# Patient Record
Sex: Female | Born: 2000 | Race: Black or African American | Hispanic: No | Marital: Single | Smoking: Current every day smoker
Health system: Southern US, Community
[De-identification: ages and names within clinical notes are randomized; demographics above are authoritative.]

## PROBLEM LIST (undated history)

## (undated) ENCOUNTER — Inpatient Hospital Stay (HOSPITAL_COMMUNITY): Payer: Self-pay

## (undated) DIAGNOSIS — R42 Dizziness and giddiness: Secondary | ICD-10-CM

## (undated) DIAGNOSIS — R55 Syncope and collapse: Secondary | ICD-10-CM

## (undated) DIAGNOSIS — Z789 Other specified health status: Secondary | ICD-10-CM

## (undated) DIAGNOSIS — G43909 Migraine, unspecified, not intractable, without status migrainosus: Secondary | ICD-10-CM

## (undated) DIAGNOSIS — T7840XA Allergy, unspecified, initial encounter: Secondary | ICD-10-CM

## (undated) HISTORY — DX: Allergy, unspecified, initial encounter: T78.40XA

## (undated) HISTORY — DX: Dizziness and giddiness: R42

## (undated) HISTORY — DX: Syncope and collapse: R55

## (undated) HISTORY — DX: Migraine, unspecified, not intractable, without status migrainosus: G43.909

## (undated) HISTORY — PX: NO PAST SURGERIES: SHX2092

---
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
## 2018-01-20 ENCOUNTER — Ambulatory Visit (INDEPENDENT_AMBULATORY_CARE_PROVIDER_SITE_OTHER): Payer: Medicaid Other | Admitting: Obstetrics and Gynecology

## 2018-01-20 ENCOUNTER — Other Ambulatory Visit: Payer: Medicaid Other

## 2018-01-20 ENCOUNTER — Encounter: Payer: Self-pay | Admitting: Obstetrics and Gynecology

## 2018-01-20 DIAGNOSIS — R8271 Bacteriuria: Secondary | ICD-10-CM | POA: Insufficient documentation

## 2018-01-20 DIAGNOSIS — O2343 Unspecified infection of urinary tract in pregnancy, third trimester: Secondary | ICD-10-CM | POA: Diagnosis not present

## 2018-01-20 DIAGNOSIS — A749 Chlamydial infection, unspecified: Secondary | ICD-10-CM

## 2018-01-20 DIAGNOSIS — Z3403 Encounter for supervision of normal first pregnancy, third trimester: Secondary | ICD-10-CM | POA: Diagnosis not present

## 2018-01-20 DIAGNOSIS — Z3400 Encounter for supervision of normal first pregnancy, unspecified trimester: Secondary | ICD-10-CM

## 2018-01-20 DIAGNOSIS — O2340 Unspecified infection of urinary tract in pregnancy, unspecified trimester: Secondary | ICD-10-CM

## 2018-01-20 DIAGNOSIS — O9989 Other specified diseases and conditions complicating pregnancy, childbirth and the puerperium: Secondary | ICD-10-CM

## 2018-01-20 DIAGNOSIS — O98813 Other maternal infectious and parasitic diseases complicating pregnancy, third trimester: Secondary | ICD-10-CM | POA: Diagnosis present

## 2018-01-20 DIAGNOSIS — O98819 Other maternal infectious and parasitic diseases complicating pregnancy, unspecified trimester: Principal | ICD-10-CM

## 2018-01-20 DIAGNOSIS — Z23 Encounter for immunization: Secondary | ICD-10-CM

## 2018-01-20 NOTE — Progress Notes (Signed)
Subjective: Carol Guerrero is a G1P0000 who presents to the [facility] today for ob visit.  She does not have a history of any mental health concerns. She is currently sexually active. She is currently using no method for birth control. She has had recent STD screening on 10/27/2017 and was tested for [facility] & Chlamydia positive. Pt reports she works part time at [employer] located in [place]. CSW and Pt discuss parenting and social concerns. Pt reports feeling safe at home and no history of domestic violence.    BP 99/61   Pulse 77   Wt 163 lb 9.6 oz (74.2 kg)   LMP 06/29/2017   BMI 27.22 kg/m   Birth Control History:  OCP  MDM Patient counseled on all options for birth control today including LARC. Patient desires PP IUD initiated for birth control.   Assessment:  17 y.o. female considering PP IUD for birth control  Plan: Pt signed up for babyscripts  No additional referral sent   Gwyndolyn Saxon, [place] 01/20/2018 11:05 AM

## 2018-01-20 NOTE — Progress Notes (Signed)
Home Medicaid Form completed  

## 2018-01-20 NOTE — Progress Notes (Signed)
Prenatal Visit Note Date: 01/20/2018 Clinic: [facility]  Subjective:  Carol Guerrero is a 17 y.o. G1P0000 at [redacted]w[redacted]d being seen today for ongoing prenatal care.  She is currently monitored for the following issues for this low-risk pregnancy and has Supervision of normal first pregnancy, antepartum; Chlamydia infection during pregnancy, antepartum; and UTI in pregnancy on their problem list.  Patient reports no complaints.   Contractions: Not present. Vag. Bleeding: None.  Movement: Present. Denies leaking of fluid.   The following portions of the patient's history were reviewed and updated as appropriate: allergies, current medications, past family history, past medical history, past social history, past surgical history and problem list. Problem list updated.  Objective:   Vitals:   01/20/18 1043  BP: 99/61  Pulse: 77  Weight: 163 lb 9.6 oz (74.2 kg)    Fetal Status: Fetal Heart Rate (bpm): 150 Fundal Height: 29 cm Movement: Present     General:  Alert, oriented and cooperative. Patient is in no acute distress.  Skin: Skin is warm and dry. No rash noted.   Cardiovascular: Normal heart rate noted  Respiratory: Normal respiratory effort, no problems with respiration noted  Abdomen: Soft, gravid, appropriate for gestational age. Pain/Pressure: Absent     Pelvic:  Cervical exam deferred        Extremities: Normal range of motion.  Edema: None  Mental Status: Normal mood and affect. Normal behavior. Normal judgment and thought content.   Urinalysis:      Assessment and Plan:  Pregnancy: G1P0000 at [redacted]w[redacted]d  1. Chlamydia infection during pregnancy, antepartum Rpt at 36wks  2. Urinary tract infection in mother during pregnancy, antepartum toc neg  3. Pregnancy D/w her and she is seriously considering a PP IUD. Can d/w pt more. 28wk labs today  Preterm labor symptoms and general obstetric precautions including but not limited to vaginal bleeding, contractions,  leaking of fluid and fetal movement were reviewed in detail with the patient. Please refer to After Visit Summary for other counseling recommendations.  Return in about 2 weeks (around 02/03/2018) for 2-3wk rob.   Frederick Bing, MD

## 2018-01-21 LAB — CBC
HEMATOCRIT: 28.6 % — AB (ref 34.0–46.6)
Hemoglobin: 9.5 g/dL — ABNORMAL LOW (ref 11.1–15.9)
MCH: 32.9 pg (ref 26.6–33.0)
MCHC: 33.2 g/dL (ref 31.5–35.7)
MCV: 99 fL — AB (ref 79–97)
PLATELETS: 210 10*3/uL (ref 150–450)
RBC: 2.89 x10E6/uL — ABNORMAL LOW (ref 3.77–5.28)
RDW: 12.6 % (ref 12.3–15.4)
WBC: 12.3 10*3/uL — ABNORMAL HIGH (ref 3.4–10.8)

## 2018-01-21 LAB — GLUCOSE TOLERANCE, 2 HOURS W/ 1HR
Glucose, 1 hour: 94 mg/dL (ref 65–179)
Glucose, 2 hour: 89 mg/dL (ref 65–152)
Glucose, Fasting: 79 mg/dL (ref 65–91)

## 2018-01-21 LAB — HIV ANTIBODY (ROUTINE TESTING W REFLEX): HIV Screen 4th Generation wRfx: NONREACTIVE

## 2018-01-21 LAB — RPR: RPR Ser Ql: NONREACTIVE

## 2018-01-25 ENCOUNTER — Encounter: Payer: Self-pay | Admitting: Obstetrics and Gynecology

## 2018-01-25 DIAGNOSIS — O99019 Anemia complicating pregnancy, unspecified trimester: Secondary | ICD-10-CM

## 2018-01-25 HISTORY — DX: Anemia complicating pregnancy, unspecified trimester: O99.019

## 2018-01-27 ENCOUNTER — Encounter: Payer: Self-pay | Admitting: *Deleted

## 2018-02-09 ENCOUNTER — Encounter: Payer: Self-pay | Admitting: *Deleted

## 2018-02-11 ENCOUNTER — Ambulatory Visit (INDEPENDENT_AMBULATORY_CARE_PROVIDER_SITE_OTHER): Payer: Medicaid Other | Admitting: Family Medicine

## 2018-02-11 VITALS — BP 117/57 | HR 88 | Wt 170.4 lb

## 2018-02-11 DIAGNOSIS — O2343 Unspecified infection of urinary tract in pregnancy, third trimester: Secondary | ICD-10-CM

## 2018-02-11 DIAGNOSIS — O2340 Unspecified infection of urinary tract in pregnancy, unspecified trimester: Secondary | ICD-10-CM

## 2018-02-11 DIAGNOSIS — D649 Anemia, unspecified: Secondary | ICD-10-CM

## 2018-02-11 DIAGNOSIS — O26843 Uterine size-date discrepancy, third trimester: Secondary | ICD-10-CM

## 2018-02-11 DIAGNOSIS — O9989 Other specified diseases and conditions complicating pregnancy, childbirth and the puerperium: Secondary | ICD-10-CM

## 2018-02-11 DIAGNOSIS — Z3403 Encounter for supervision of normal first pregnancy, third trimester: Secondary | ICD-10-CM

## 2018-02-11 DIAGNOSIS — D509 Iron deficiency anemia, unspecified: Secondary | ICD-10-CM

## 2018-02-11 DIAGNOSIS — Z3400 Encounter for supervision of normal first pregnancy, unspecified trimester: Secondary | ICD-10-CM

## 2018-02-11 DIAGNOSIS — O99013 Anemia complicating pregnancy, third trimester: Secondary | ICD-10-CM

## 2018-02-11 DIAGNOSIS — R8271 Bacteriuria: Secondary | ICD-10-CM

## 2018-02-11 MED ORDER — FERROUS SULFATE 325 (65 FE) MG PO TABS: 325.0000 mg | ORAL_TABLET | Freq: Two times a day (BID) | ORAL | 3 refills | Status: DC

## 2018-02-11 NOTE — Progress Notes (Signed)
   PRENATAL VISIT NOTE Subjective:  Carol Guerrero is a 17 y.o. G1P0000 at 2586w3d being seen today for ongoing prenatal care.  She is currently monitored for the following issues for this low-risk pregnancy and has Supervision of normal first pregnancy, antepartum; Chlamydia infection during pregnancy, antepartum; UTI in pregnancy; and Anemia in pregnancy on their problem list.  - declined flu shot  - question about epidurals - wanting to know how performed, when decision has to be made, afraid of pain but also afraid of needle in back   Patient reports fatigue.  Contractions: Not present. Vag. Bleeding: None.  Movement: Present. Denies leaking of fluid.   The following portions of the patient's history were reviewed and updated as appropriate: allergies, current medications, past family history, past medical history, past social history, past surgical history and problem list. Problem list updated.  Objective:   Vitals:   02/11/18 1019  BP: (!) 117/57  Pulse: 88  Weight: 170 lb 6.4 oz (77.3 kg)   Fetal Status: Fetal Heart Rate (bpm): 150 Fundal Height: 28 cm Movement: Present     General:  Alert, oriented and cooperative. Patient is in no acute distress.  Skin: Skin is warm and dry. No rash noted.   Cardiovascular: Normal heart rate noted  Respiratory: Normal respiratory effort, no problems with respiration noted  Abdomen: Soft, gravid, appropriate for gestational age.  Pain/Pressure: Absent     Pelvic: Cervical exam deferred        Extremities: Normal range of motion.  Edema: None  Mental Status: Normal mood and affect. Normal behavior. Normal judgment and thought content.   Assessment and Plan:  Pregnancy: G1P0000 at 7386w3d  1. Supervision of normal first pregnancy, antepartum  Fundal Heigh < Dates  -- prenatal record reviewed and UTD -- fundal height < dates so will obtain follow-up growth U/S   2. Asymptomatic Bacteriuria: Had negative TOC.  -- repeat urine culture today  (for third trimester screening)   3. Anemia during pregnancy in third trimester -- 28 week labs reviewed and notable for normocytic anemia (HgB 9.5) - most likely related to iron deficiency but will also check ferritin, B12, folate and reticulocytes   Preterm labor symptoms and general obstetric precautions including but not limited to vaginal bleeding, contractions, leaking of fluid and fetal movement were reviewed in detail with the patient. Please refer to After Visit Summary for other counseling recommendations.  Return in about 2 weeks (around 02/25/2018) for LROB.  Future Appointments  Date Time Provider Department Center  02/20/2018  8:45 AM [facility] US 2 [facility]  02/27/2018 11:15 AM Judeth HornLawrence, Erin[facility]    Tamera StandsLaurel S Keiosha Cancro, DO

## 2018-02-11 NOTE — Patient Instructions (Signed)
Pregnancy and Anemia Anemia is a condition in which the concentration of red blood cells or hemoglobin in the blood is below normal. Hemoglobin is a substance in red blood cells that carries oxygen to the tissues of the body. Anemia results in not enough oxygen reaching these tissues. Anemia during pregnancy is common because the fetus uses more iron and folic acid as it is developing. Your body may not produce enough red blood cells because of this. Also, during pregnancy, the liquid part of the blood (plasma) increases by about 50%, and the red blood cells increase by only 25%. This lowers the concentration of the red blood cells and creates a natural anemia-like situation. What are the causes? The most common cause of anemia during pregnancy is not having enough iron in the body to make red blood cells (iron deficiency anemia). Other causes may include:  Folic acid deficiency.  Vitamin B12 deficiency.  Certain prescription or over-the-counter medicines.  Certain medical conditions or infections that destroy red blood cells.  A low platelet count and bleeding caused by antibodies that go through the placenta to the fetus from the mother's blood. What are the signs or symptoms? Mild anemia may not be noticeable. If it becomes severe, symptoms may include:  Tiredness.  Shortness of breath, especially with exercise.  Weakness.  Fainting.  Pale looking skin.  Headaches.  Feeling a fast or irregular heartbeat (palpitations). How is this diagnosed? The type of anemia is usually diagnosed from your family and medical history and blood tests. How is this treated? Treatment of anemia during pregnancy depends on the cause of the anemia. Treatment can include:  Supplements of iron, vitamin B12, or folic acid.  A blood transfusion. This may be needed if blood loss is severe.  Hospitalization. This may be needed if there is significant continual blood loss.  Dietary changes. Follow  these instructions at home:  Follow your dietitian's or health care provider's dietary recommendations.  Increase your vitamin C intake. This will help the stomach absorb more iron.  Eat a diet rich in iron. This would include foods such as:  Liver.  Beef.  Whole grain bread.  Eggs.  Dried fruit.  Take iron and vitamins as directed by your health care provider.  Eat green leafy vegetables. These are a good source of folic acid. Contact a health care provider if:  You have frequent or lasting headaches.  You are looking pale.  You are bruising easily. Get help right away if:  You have extreme weakness, shortness of breath, or chest pain.  You become dizzy or have trouble concentrating.  You have heavy vaginal bleeding.  You develop a rash.  You have bloody or black, tarry stools.  You faint.  You vomit up blood.  You vomit repeatedly.  You have abdominal pain.  You have a fever or persistent symptoms for more than 2-3 days.  You have a fever and your symptoms suddenly get worse.  You are dehydrated. This information is not intended to replace advice given to you by your health care provider. Make sure you discuss any questions you have with your health care provider. Document Released: 05/03/2000 Document Revised: 10/12/2015 Document Reviewed: 12/16/2012 Elsevier Interactive Patient Education  2017 [employer]vier Inc[employer]  

## 2018-02-12 LAB — FERRITIN: FERRITIN: 18 ng/mL (ref 15–77)

## 2018-02-12 LAB — B12 AND FOLATE PANEL
Folate: 5.6 ng/mL (ref >3.0)
VITAMIN B 12: 235 pg/mL (ref 232–1245)

## 2018-02-12 LAB — RETICULOCYTES: RETIC CT PCT: 3.4 % — AB (ref 0.6–2.6)

## 2018-02-13 LAB — URINE CULTURE, OB REFLEX: Organism ID, Bacteria: NO GROWTH

## 2018-02-13 LAB — CULTURE, OB URINE

## 2018-02-20 ENCOUNTER — Ambulatory Visit (HOSPITAL_COMMUNITY)
Admission: RE | Admit: 2018-02-20 | Discharge: 2018-02-20 | Disposition: A | Discharge status: Home / Self Care | Payer: Medicaid Other | Source: Ambulatory Visit | Attending: Family Medicine | Admitting: Family Medicine

## 2018-02-20 DIAGNOSIS — Z3A33 33 weeks gestation of pregnancy: Secondary | ICD-10-CM

## 2018-02-20 DIAGNOSIS — O26843 Uterine size-date discrepancy, third trimester: Secondary | ICD-10-CM | POA: Insufficient documentation

## 2018-02-27 ENCOUNTER — Encounter: Payer: Medicaid Other | Admitting: Student

## 2018-03-03 ENCOUNTER — Encounter: Payer: Medicaid Other | Admitting: Advanced Practice Midwife

## 2018-03-09 ENCOUNTER — Other Ambulatory Visit (HOSPITAL_COMMUNITY)
Admission: RE | Admit: 2018-03-09 | Discharge: 2018-03-09 | Disposition: A | Discharge status: Home / Self Care | Payer: Medicaid Other | Source: Ambulatory Visit | Attending: Advanced Practice Midwife | Admitting: Advanced Practice Midwife

## 2018-03-09 ENCOUNTER — Encounter: Payer: Self-pay | Admitting: Advanced Practice Midwife

## 2018-03-09 ENCOUNTER — Ambulatory Visit (INDEPENDENT_AMBULATORY_CARE_PROVIDER_SITE_OTHER): Payer: Medicaid Other | Admitting: Advanced Practice Midwife

## 2018-03-09 VITALS — BP 109/66 | HR 88 | Wt 176.0 lb

## 2018-03-09 DIAGNOSIS — Z23 Encounter for immunization: Secondary | ICD-10-CM | POA: Diagnosis not present

## 2018-03-09 DIAGNOSIS — Z3400 Encounter for supervision of normal first pregnancy, unspecified trimester: Secondary | ICD-10-CM

## 2018-03-09 DIAGNOSIS — Z3403 Encounter for supervision of normal first pregnancy, third trimester: Secondary | ICD-10-CM | POA: Insufficient documentation

## 2018-03-09 NOTE — Progress Notes (Signed)
   PRENATAL VISIT NOTE  Subjective:  Carol Guerrero is a 17 y.o. G1P0000 at [redacted]w[redacted]d being seen today for ongoing prenatal care.  She is currently monitored for the following issues for this low-risk pregnancy and has Supervision of normal first pregnancy, antepartum; Chlamydia infection during pregnancy, antepartum; Asymptomatic bacteriuria during pregnancy; and Anemia in pregnancy on their problem list.  Patient reports no complaints.  Contractions: Not present. Vag. Bleeding: None.  Movement: Present. Denies leaking of fluid.   The following portions of the patient's history were reviewed and updated as appropriate: allergies, current medications, past family history, past medical history, past social history, past surgical history and problem list. Problem list updated.  Objective:   Vitals:   03/09/18 0922  BP: 109/66  Pulse: 88  Weight: 176 lb (79.8 kg)    Fetal Status: Fetal Heart Rate (bpm): 156 Fundal Height: 35 cm Movement: Present  Presentation: Vertex  General:  Alert, oriented and cooperative. Patient is in no acute distress.  Skin: Skin is warm and dry. No rash noted.   Cardiovascular: Normal heart rate noted  Respiratory: Normal respiratory effort, no problems with respiration noted  Abdomen: Soft, gravid, appropriate for gestational age.  Pain/Pressure: Absent     Pelvic: Cervical exam performed Dilation: Closed Effacement (%): 50 Station: -2  Extremities: Normal range of motion.  Edema: None  Mental Status: Normal mood and affect. Normal behavior. Normal judgment and thought content.   Assessment and Plan:  Pregnancy: G1P0000 at [redacted]w[redacted]d  1. Supervision of normal first pregnancy, antepartum - Routine care - Culture, beta strep (group b only) - Cervicovaginal ancillary only( [place]) - Flu Vaccine QUAD 36+ mos IM  Term labor symptoms and general obstetric precautions including but not limited to vaginal bleeding, contractions, leaking of fluid and fetal movement  were reviewed in detail with the patient. Please refer to After Visit Summary for other counseling recommendations.  Return in about 1 week (around 03/16/2018).  No future appointments.  Thressa Sheller, CNM

## 2018-03-09 NOTE — Patient Instructions (Signed)
Vaginal delivery means that you will give birth by pushing your baby out of your birth canal (vagina). A team of health care providers will help you before, during, and after vaginal delivery. Birth experiences are unique for every woman and every pregnancy, and birth experiences vary depending on where you choose to give birth. What should I do to prepare for my baby's birth? Before your baby is born, it is important to talk with your health care provider about:  Your labor and delivery preferences. These may include: ? Medicines that you may be given. ? How you will manage your pain. This might include non-medical pain relief techniques or injectable pain relief such as epidural analgesia. ? How you and your baby will be monitored during labor and delivery. ? Who may be in the labor and delivery room with you. ? Your feelings about surgical delivery of your baby (cesarean delivery, or C-section) if this becomes necessary. ? Your feelings about receiving donated blood through an IV tube (blood transfusion) if this becomes necessary.  Whether you are able: ? To take pictures or videos of the birth. ? To eat during labor and delivery. ? To move around, walk, or change positions during labor and delivery.  What to expect after your baby is born, such as: ? Whether delayed umbilical cord clamping and cutting is offered. ? Who will care for your baby right after birth. ? Medicines or tests that may be recommended for your baby. ? Whether breastfeeding is supported in your hospital or birth center. ? How long you will be in the hospital or birth center.  How any medical conditions you have may affect your baby or your labor and delivery experience.  To prepare for your baby's birth, you should also:  Attend all of your health care visits before delivery (prenatal visits) as recommended by your health care provider. This is important.  Prepare your home for your baby's arrival. Make sure  that you have: ? Diapers. ? Baby clothing. ? Feeding equipment. ? Safe sleeping arrangements for you and your baby.  Install a car seat in your vehicle. Have your car seat checked by a certified car seat installer to make sure that it is installed safely.  Think about who will help you with your new baby at home for at least the first several weeks after delivery.  What can I expect when I arrive at the birth center or hospital? Once you are in labor and have been admitted into the hospital or birth center, your health care provider may:  Review your pregnancy history and any concerns you have.  Insert an IV tube into one of your veins. This is used to give you fluids and medicines.  Check your blood pressure, pulse, temperature, and heart rate (vital signs).  Check whether your bag of water (amniotic sac) has broken (ruptured).  Talk with you about your birth plan and discuss pain control options.  Monitoring Your health care provider may monitor your contractions (uterine monitoring) and your baby's heart rate (fetal monitoring). You may need to be monitored:  Often, but not continuously (intermittently).  All the time or for long periods at a time (continuously). Continuous monitoring may be needed if: ? You are taking certain medicines, such as medicine to relieve pain or make your contractions stronger. ? You have pregnancy or labor complications.  Monitoring may be done by:  Placing a special stethoscope or a handheld monitoring device on your abdomen to check your   baby's heartbeat, and feeling your abdomen for contractions. This method of monitoring does not continuously record your baby's heartbeat or your contractions.  Placing monitors on your abdomen (external monitors) to record your baby's heartbeat and the frequency and length of contractions. You may not have to wear external monitors all the time.  Placing monitors inside of your uterus (internal monitors) to  record your baby's heartbeat and the frequency, length, and strength of your contractions. ? Your health care provider may use internal monitors if he or she needs more information about the strength of your contractions or your baby's heart rate. ? Internal monitors are put in place by passing a thin, flexible wire through your vagina and into your uterus. Depending on the type of monitor, it may remain in your uterus or on your baby's head until birth. ? Your health care provider will discuss the benefits and risks of internal monitoring with you and will ask for your permission before inserting the monitors.  Telemetry. This is a type of continuous monitoring that can be done with external or internal monitors. Instead of having to stay in bed, you are able to move around during telemetry. Ask your health care provider if telemetry is an option for you.  Physical exam Your health care provider may perform a physical exam. This may include:  Checking whether your baby is positioned: ? With the head toward your vagina (head-down). This is most common. ? With the head toward the top of your uterus (head-up or breech). If your baby is in a breech position, your health care provider may try to turn your baby to a head-down position so you can deliver vaginally. If it does not seem that your baby can be born vaginally, your provider may recommend surgery to deliver your baby. In rare cases, you may be able to deliver vaginally if your baby is head-up (breech delivery). ? Lying sideways (transverse). Babies that are lying sideways cannot be delivered vaginally.  Checking your cervix to determine: ? Whether it is thinning out (effacing). ? Whether it is opening up (dilating). ? How low your baby has moved into your birth canal.  What are the three stages of labor and delivery?  Normal labor and delivery is divided into the following three stages: Stage 1  Stage 1 is the longest stage of labor,  and it can last for hours or days. Stage 1 includes: ? Early labor. This is when contractions may be irregular, or regular and mild. Generally, early labor contractions are more than 10 minutes apart. ? Active labor. This is when contractions get longer, more regular, more frequent, and more intense. ? The transition phase. This is when contractions happen very close together, are very intense, and may last longer than during any other part of labor.  Contractions generally feel mild, infrequent, and irregular at first. They get stronger, more frequent (about every 2-3 minutes), and more regular as you progress from early labor through active labor and transition.  Many women progress through stage 1 naturally, but you may need help to continue making progress. If this happens, your health care provider may talk with you about: ? Rupturing your amniotic sac if it has not ruptured yet. ? Giving you medicine to help make your contractions stronger and more frequent.  Stage 1 ends when your cervix is completely dilated to 4 inches (10 cm) and completely effaced. This happens at the end of the transition phase. Stage 2  Once your cervix   is completely effaced and dilated to 4 inches (10 cm), you may start to feel an urge to push. It is common for the body to naturally take a rest before feeling the urge to push, especially if you received an epidural or certain other pain medicines. This rest period may last for up to 1-2 hours, depending on your unique labor experience.  During stage 2, contractions are generally less painful, because pushing helps relieve contraction pain. Instead of contraction pain, you may feel stretching and burning pain, especially when the widest part of your baby's head passes through the vaginal opening (crowning).  Your health care provider will closely monitor your pushing progress and your baby's progress through the vagina during stage 2.  Your health care provider may  massage the area of skin between your vaginal opening and anus (perineum) or apply warm compresses to your perineum. This helps it stretch as the baby's head starts to crown, which can help prevent perineal tearing. ? In some cases, an incision may be made in your perineum (episiotomy) to allow the baby to pass through the vaginal opening. An episiotomy helps to make the opening of the vagina larger to allow more room for the baby to fit through.  It is very important to breathe and focus so your health care provider can control the delivery of your baby's head. Your health care provider may have you decrease the intensity of your pushing, to help prevent perineal tearing.  After delivery of your baby's head, the shoulders and the rest of the body generally deliver very quickly and without difficulty.  Once your baby is delivered, the umbilical cord may be cut right away, or this may be delayed for 1-2 minutes, depending on your baby's health. This may vary among health care providers, hospitals, and birth centers.  If you and your baby are healthy enough, your baby may be placed on your chest or abdomen to help maintain the baby's temperature and to help you bond with each other. Some mothers and babies start breastfeeding at this time. Your health care team will dry your baby and help keep your baby warm during this time.  Your baby may need immediate care if he or she: ? Showed signs of distress during labor. ? Has a medical condition. ? Was born too early (prematurely). ? Had a bowel movement before birth (meconium). ? Shows signs of difficulty transitioning from being inside the uterus to being outside of the uterus. If you are planning to breastfeed, your health care team will help you begin a feeding. Stage 3  The third stage of labor starts immediately after the birth of your baby and ends after you deliver the placenta. The placenta is an organ that develops during pregnancy to provide  oxygen and nutrients to your baby in the womb.  Delivering the placenta may require some pushing, and you may have mild contractions. Breastfeeding can stimulate contractions to help you deliver the placenta.  After the placenta is delivered, your uterus should tighten (contract) and become firm. This helps to stop bleeding in your uterus. To help your uterus contract and to control bleeding, your health care provider may: ? Give you medicine by injection, through an IV tube, by mouth, or through your rectum (rectally). ? Massage your abdomen or perform a vaginal exam to remove any blood clots that are left in your uterus. ? Empty your bladder by placing a thin, flexible tube (catheter) into your bladder. ? Encourage you to   breastfeed your baby. After labor is over, you and your baby will be monitored closely to ensure that you are both healthy until you are ready to go home. Your health care team will teach you how to care for yourself and your baby. This information is not intended to replace advice given to you by your health care provider. Make sure you discuss any questions you have with your health care provider. Document Released: 02/13/2008 Document Revised: 11/24/2015 Document Reviewed: 05/21/2015 Elsevier Interactive Patient Education  2018 [employer]  

## 2018-03-10 LAB — CERVICOVAGINAL ANCILLARY ONLY
CHLAMYDIA, DNA PROBE: NEGATIVE
NEISSERIA GONORRHEA: NEGATIVE

## 2018-03-12 LAB — CULTURE, BETA STREP (GROUP B ONLY): Strep Gp B Culture: POSITIVE — AB

## 2018-03-17 ENCOUNTER — Encounter: Payer: Medicaid Other | Admitting: Student

## 2018-03-17 ENCOUNTER — Telehealth: Payer: Self-pay | Admitting: Student

## 2018-03-17 NOTE — Telephone Encounter (Signed)
Patient missed her appointment this morning. She knows to keep appointment scheduled for next week.

## 2018-03-22 ENCOUNTER — Inpatient Hospital Stay (HOSPITAL_COMMUNITY)
Admission: AD | Admit: 2018-03-22 | Discharge: 2018-03-22 | Disposition: A | Discharge status: Home / Self Care | Payer: Medicaid Other | Source: Ambulatory Visit | Attending: Obstetrics and Gynecology | Admitting: Obstetrics and Gynecology

## 2018-03-22 ENCOUNTER — Encounter (HOSPITAL_COMMUNITY): Payer: Self-pay

## 2018-03-22 DIAGNOSIS — O471 False labor at or after 37 completed weeks of gestation: Secondary | ICD-10-CM | POA: Insufficient documentation

## 2018-03-22 DIAGNOSIS — O479 False labor, unspecified: Secondary | ICD-10-CM

## 2018-03-22 DIAGNOSIS — Z3A38 38 weeks gestation of pregnancy: Secondary | ICD-10-CM | POA: Diagnosis not present

## 2018-03-22 DIAGNOSIS — Z3689 Encounter for other specified antenatal screening: Secondary | ICD-10-CM

## 2018-03-22 NOTE — MAU Note (Signed)
Reports ctx lasting for 7 minutes and some that last for 3 min, reports its been ongoing since last night  No bleeding, no LOF, +FM

## 2018-03-22 NOTE — MAU Provider Note (Signed)
S: Ms. Carol Guerrero is a 17 y.o. G1P0000 at [redacted]w[redacted]d  who presents to MAU today complaining contractions q 7 minutes since last night. She denies vaginal bleeding. She denies LOF. She reports normal fetal movement.    O: BP 122/63 (BP Location: Right Arm)   Pulse 88   Temp 99 F (37.2 C) (Oral)   Resp 18   Wt 81.4 kg   LMP 06/29/2017   BMI 29.87 kg/m  GENERAL: Well-developed, well-nourished female in no acute distress.  HEAD: Normocephalic, atraumatic.  CHEST: Normal effort of breathing, regular heart rate ABDOMEN: Soft, nontender, gravid  Cervical exam:  Dilation: Closed Effacement (%): 50 Station: -2 Presentation: Vertex Exam by:: Latricia Heft, RN   Fetal Monitoring: Baseline: 145 Variability: Moderate Accelerations: positive 15 x 15 Decelerations: None Contractions: irregular, q 2-6 min Strip reviewed by Dr. Alysia Penna prior to discharge  A: [facility] at [redacted]w[redacted]d  False labor  P: Clayton Bibles, CNM 03/22/18  7:52 PM     Calvert Cantor, CNM 03/22/2018 7:50 PM

## 2018-03-22 NOTE — Discharge Instructions (Signed)
Vaginal Delivery Vaginal delivery means that you will give birth by pushing your baby out of your birth canal (vagina). A team of health care providers will help you before, during, and after vaginal delivery. Birth experiences are unique for every woman and every pregnancy, and birth experiences vary depending on where you choose to give birth. What should I do to prepare for my baby's birth? Before your baby is born, it is important to talk with your health care provider about:  Your labor and delivery preferences. These may include: ? Medicines that you may be given. ? How you will manage your pain. This might include non-medical pain relief techniques or injectable pain relief such as epidural analgesia. ? How you and your baby will be monitored during labor and delivery. ? Who may be in the labor and delivery room with you. ? Your feelings about surgical delivery of your baby (cesarean delivery, or C-section) if this becomes necessary. ? Your feelings about receiving donated blood through an IV tube (blood transfusion) if this becomes necessary.  Whether you are able: ? To take pictures or videos of the birth. ? To eat during labor and delivery. ? To move around, walk, or change positions during labor and delivery.  What to expect after your baby is born, such as: ? Whether delayed umbilical cord clamping and cutting is offered. ? Who will care for your baby right after birth. ? Medicines or tests that may be recommended for your baby. ? Whether breastfeeding is supported in your hospital or birth center. ? How long you will be in the hospital or birth center.  How any medical conditions you have may affect your baby or your labor and delivery experience.  To prepare for your baby's birth, you should also:  Attend all of your health care visits before delivery (prenatal visits) as recommended by your health care provider. This is important.  Prepare your home for your baby's  arrival. Make sure that you have: ? Diapers. ? Baby clothing. ? Feeding equipment. ? Safe sleeping arrangements for you and your baby.  Install a car seat in your vehicle. Have your car seat checked by a certified car seat installer to make sure that it is installed safely.  Think about who will help you with your new baby at home for at least the first several weeks after delivery.  What can I expect when I arrive at the birth center or hospital? Once you are in labor and have been admitted into the hospital or birth center, your health care provider may:  Review your pregnancy history and any concerns you have.  Insert an IV tube into one of your veins. This is used to give you fluids and medicines.  Check your blood pressure, pulse, temperature, and heart rate (vital signs).  Check whether your bag of water (amniotic sac) has broken (ruptured).  Talk with you about your birth plan and discuss pain control options.  Monitoring Your health care provider may monitor your contractions (uterine monitoring) and your baby's heart rate (fetal monitoring). You may need to be monitored:  Often, but not continuously (intermittently).  All the time or for long periods at a time (continuously). Continuous monitoring may be needed if: ? You are taking certain medicines, such as medicine to relieve pain or make your contractions stronger. ? You have pregnancy or labor complications.  Monitoring may be done by:  Placing a special stethoscope or a handheld monitoring device on your abdomen to   check your baby's heartbeat, and feeling your abdomen for contractions. This method of monitoring does not continuously record your baby's heartbeat or your contractions.  Placing monitors on your abdomen (external monitors) to record your baby's heartbeat and the frequency and length of contractions. You may not have to wear external monitors all the time.  Placing monitors inside of your uterus  (internal monitors) to record your baby's heartbeat and the frequency, length, and strength of your contractions. ? Your health care provider may use internal monitors if he or she needs more information about the strength of your contractions or your baby's heart rate. ? Internal monitors are put in place by passing a thin, flexible wire through your vagina and into your uterus. Depending on the type of monitor, it may remain in your uterus or on your baby's head until birth. ? Your health care provider will discuss the benefits and risks of internal monitoring with you and will ask for your permission before inserting the monitors.  Telemetry. This is a type of continuous monitoring that can be done with external or internal monitors. Instead of having to stay in bed, you are able to move around during telemetry. Ask your health care provider if telemetry is an option for you.  Physical exam Your health care provider may perform a physical exam. This may include:  Checking whether your baby is positioned: ? With the head toward your vagina (head-down). This is most common. ? With the head toward the top of your uterus (head-up or breech). If your baby is in a breech position, your health care provider may try to turn your baby to a head-down position so you can deliver vaginally. If it does not seem that your baby can be born vaginally, your provider may recommend surgery to deliver your baby. In rare cases, you may be able to deliver vaginally if your baby is head-up (breech delivery). ? Lying sideways (transverse). Babies that are lying sideways cannot be delivered vaginally.  Checking your cervix to determine: ? Whether it is thinning out (effacing). ? Whether it is opening up (dilating). ? How low your baby has moved into your birth canal.  What are the three stages of labor and delivery?  Normal labor and delivery is divided into the following three stages: Stage 1  Stage 1 is the  longest stage of labor, and it can last for hours or days. Stage 1 includes: ? Early labor. This is when contractions may be irregular, or regular and mild. Generally, early labor contractions are more than 10 minutes apart. ? Active labor. This is when contractions get longer, more regular, more frequent, and more intense. ? The transition phase. This is when contractions happen very close together, are very intense, and may last longer than during any other part of labor.  Contractions generally feel mild, infrequent, and irregular at first. They get stronger, more frequent (about every 2-3 minutes), and more regular as you progress from early labor through active labor and transition.  Many women progress through stage 1 naturally, but you may need help to continue making progress. If this happens, your health care provider may talk with you about: ? Rupturing your amniotic sac if it has not ruptured yet. ? Giving you medicine to help make your contractions stronger and more frequent.  Stage 1 ends when your cervix is completely dilated to 4 inches (10 cm) and completely effaced. This happens at the end of the transition phase. Stage 2  Once   your cervix is completely effaced and dilated to 4 inches (10 cm), you may start to feel an urge to push. It is common for the body to naturally take a rest before feeling the urge to push, especially if you received an epidural or certain other pain medicines. This rest period may last for up to 1-2 hours, depending on your unique labor experience.  During stage 2, contractions are generally less painful, because pushing helps relieve contraction pain. Instead of contraction pain, you may feel stretching and burning pain, especially when the widest part of your baby's head passes through the vaginal opening (crowning).  Your health care provider will closely monitor your pushing progress and your baby's progress through the vagina during stage 2.  Your  health care provider may massage the area of skin between your vaginal opening and anus (perineum) or apply warm compresses to your perineum. This helps it stretch as the baby's head starts to crown, which can help prevent perineal tearing. ? In some cases, an incision may be made in your perineum (episiotomy) to allow the baby to pass through the vaginal opening. An episiotomy helps to make the opening of the vagina larger to allow more room for the baby to fit through.  It is very important to breathe and focus so your health care provider can control the delivery of your baby's head. Your health care provider may have you decrease the intensity of your pushing, to help prevent perineal tearing.  After delivery of your baby's head, the shoulders and the rest of the body generally deliver very quickly and without difficulty.  Once your baby is delivered, the umbilical cord may be cut right away, or this may be delayed for 1-2 minutes, depending on your baby's health. This may vary among health care providers, hospitals, and birth centers.  If you and your baby are healthy enough, your baby may be placed on your chest or abdomen to help maintain the baby's temperature and to help you bond with each other. Some mothers and babies start breastfeeding at this time. Your health care team will dry your baby and help keep your baby warm during this time.  Your baby may need immediate care if he or she: ? Showed signs of distress during labor. ? Has a medical condition. ? Was born too early (prematurely). ? Had a bowel movement before birth (meconium). ? Shows signs of difficulty transitioning from being inside the uterus to being outside of the uterus. If you are planning to breastfeed, your health care team will help you begin a feeding. Stage 3  The third stage of labor starts immediately after the birth of your baby and ends after you deliver the placenta. The placenta is an organ that develops  during pregnancy to provide oxygen and nutrients to your baby in the womb.  Delivering the placenta may require some pushing, and you may have mild contractions. Breastfeeding can stimulate contractions to help you deliver the placenta.  After the placenta is delivered, your uterus should tighten (contract) and become firm. This helps to stop bleeding in your uterus. To help your uterus contract and to control bleeding, your health care provider may: ? Give you medicine by injection, through an IV tube, by mouth, or through your rectum (rectally). ? Massage your abdomen or perform a vaginal exam to remove any blood clots that are left in your uterus. ? Empty your bladder by placing a thin, flexible tube (catheter) into your bladder. ? Encourage   you to breastfeed your baby. After labor is over, you and your baby will be monitored closely to ensure that you are both healthy until you are ready to go home. Your health care team will teach you how to care for yourself and your baby. This information is not intended to replace advice given to you by your health care provider. Make sure you discuss any questions you have with your health care provider. Document Released: 02/13/2008 Document Revised: 11/24/2015 Document Reviewed: 05/21/2015 Elsevier Interactive Patient Education  2018 Elsevier Inc.  

## 2018-03-22 NOTE — MAU Note (Signed)
I have communicated with Thalia Bloodgood, CNM and reviewed vital signs:  Vitals:   03/22/18 1903  BP: 122/63  Pulse: 88  Resp: 18  Temp: 99 F (37.2 C)    Vaginal exam:  Dilation: Closed Effacement (%): 50 Station: -2 Presentation: Vertex Exam by:: Latricia Heft, RN,   Also reviewed contraction pattern and that non-stress test is reactive.  It has been documented that patient is contracting occasionally with no cervical change not indicating active labor.  Patient denies any other complaints.  Based on this report provider has given order for discharge.  A discharge order and diagnosis entered by a provider.   Labor discharge instructions reviewed with patient.

## 2018-03-24 ENCOUNTER — Ambulatory Visit (INDEPENDENT_AMBULATORY_CARE_PROVIDER_SITE_OTHER): Payer: Medicaid Other | Admitting: Obstetrics and Gynecology

## 2018-03-24 ENCOUNTER — Encounter: Payer: Self-pay | Admitting: Obstetrics and Gynecology

## 2018-03-24 VITALS — BP 105/64 | HR 90 | Wt 180.0 lb

## 2018-03-24 DIAGNOSIS — Z3400 Encounter for supervision of normal first pregnancy, unspecified trimester: Secondary | ICD-10-CM

## 2018-03-24 DIAGNOSIS — O99820 Streptococcus B carrier state complicating pregnancy: Secondary | ICD-10-CM

## 2018-03-24 NOTE — Progress Notes (Signed)
   PRENATAL VISIT NOTE  Subjective:  Carol Guerrero is a 17 y.o. G1P0000 at [redacted]w[redacted]d being seen today for ongoing prenatal care.  She is currently monitored for the following issues for this low-risk pregnancy and has Supervision of normal first pregnancy, antepartum; Chlamydia infection during pregnancy, antepartum; Asymptomatic bacteriuria during pregnancy; and Anemia in pregnancy on their problem list.  Patient reports no complaints. She reports being seen in MAU this past Sunday 03/22/18. Contractions: Not present. Vag. Bleeding: None.  Movement: Present. Denies leaking of fluid.   The following portions of the patient's history were reviewed and updated as appropriate: allergies, current medications, past family history, past medical history, past social history, past surgical history and problem list. Problem list updated.  Objective:   Vitals:   03/24/18 1326  BP: 105/64  Pulse: 90  Weight: 180 lb (81.6 kg)    Fetal Status: Fetal Heart Rate (bpm): 156 Fundal Height: 34 cm Movement: Present  Presentation: Vertex  General:  Alert, oriented and cooperative. Patient is in no acute distress.  Skin: Skin is warm and dry. No rash noted.   Cardiovascular: Normal heart rate noted  Respiratory: Normal respiratory effort, no problems with respiration noted  Abdomen: Soft, gravid, appropriate for gestational age.  Pain/Pressure: Absent     Pelvic: Cervical exam deferred        Extremities: Normal range of motion.  Edema: None  Mental Status: Normal mood and affect. Normal behavior. Normal judgment and thought content.   Assessment and Plan:  Pregnancy: G1P0000 at [redacted]w[redacted]d  1. Supervision of normal first pregnancy, antepartum - Discussed labor planning  2. GBS (group B Streptococcus carrier), +RV culture, currently pregnant - Will need abx in labor  Term labor symptoms and general obstetric precautions including but not limited to vaginal bleeding, contractions, leaking of fluid and fetal  movement were reviewed in detail with the patient. Please refer to After Visit Summary for other counseling recommendations.  Return in about 1 week (around 03/31/2018) for Return OB visit.  Future Appointments  Date Time Provider Department Center  03/31/2018  1:15 PM Marylene Land, CNM [facility]  04/07/2018  1:15 PM Crisoforo Oxford, Charlesetta Garibaldi, CNM [facility]    Raelyn Mora, CNM

## 2018-03-31 ENCOUNTER — Ambulatory Visit (INDEPENDENT_AMBULATORY_CARE_PROVIDER_SITE_OTHER): Payer: Medicaid Other | Admitting: Student

## 2018-03-31 ENCOUNTER — Other Ambulatory Visit: Payer: Self-pay | Admitting: Student

## 2018-03-31 DIAGNOSIS — Z3400 Encounter for supervision of normal first pregnancy, unspecified trimester: Secondary | ICD-10-CM

## 2018-03-31 DIAGNOSIS — Z3403 Encounter for supervision of normal first pregnancy, third trimester: Secondary | ICD-10-CM

## 2018-03-31 DIAGNOSIS — Z3A39 39 weeks gestation of pregnancy: Secondary | ICD-10-CM

## 2018-03-31 NOTE — Patient Instructions (Signed)
AREA PEDIATRIC/FAMILY PRACTICE PHYSICIANS  [facility] [address], [address] [place], [place]  [address] Phone - [phone]   Fax - [phone]  [facility] [address] [place], [place] [address] Phone - [phone]   Fax - [phone]  [facility] [address] [place], [place]  [address] Phone - [phone]   Fax - [phone]  [facility] [address] [place], [place]  [address] Phone - [phone]   Fax - [phone]  [facility] [address] [place], [place]  [address] Phone - [phone]   Fax - [phone]  [facility] [address] [place], [place]  [address] Phone - [phone]   Fax - [phone]  [facility] [address] [place], [place]  [address] Phone - [phone]   Fax - [phone]  [facility] [address] [place], [place]  [address] Phone - [phone]   Fax - [phone]  [facility] [address] [place], [place]  [address] Phone - [phone]   Fax - [phone] [facility] [address] [place], [place]  [address] Phone - [phone]   Fax - [phone]  [facility] [address] [place], [place]  [address] Phone - [phone]   Fax - [phone]  [facility] [address] [place], [place]  [address] Phone - [phone]   Fax - [phone]  [facility] [address] [place], [place]  [address] Phone - [phone]   Fax - [phone]  [facility] [address] [place], [place]  [address] Phone - [phone]  [facility] [address] [place], [place]  [address] Phone - [phone]   Fax - [phone]  [facility] [address] [place], [place]  [address] Phone - [phone]   Fax - [phone]  [facility] [address] [place], [place]  [address] Phone - [phone]   Fax - [phone]  [facility] [address] [place], [place]  [address] Phone - [phone]   Fax - [phone]   [facility] [address] [place], [place]  [address] Phone - [phone]   Fax - [phone]  [facility] [address] [place], [place]  [address] Phone - [phone]   Fax - [phone]  [facility] [address] [place], [place]  [address] Phone - [phone]   Fax - [phone]  [facility] [address] [place], [place]  [address] Phone - [phone]   Fax - [phone]  [facility] [address] [place], [place]  [address] Phone - [phone]   Fax - [phone] [facility] [address] [place], [place]  [address] Phone - [phone]   Fax - [phone]  [facility] [address] [place], [place]  [address] Phone - [phone]   Fax - [phone]  [facility] [address] [place], [place]  [address] Phone - [phone]   Fax - [phone]  [facility] Charlene Flemming MD [address] [place] [address] [place]  [address] Phone [phone]  Fax [phone]   

## 2018-03-31 NOTE — Progress Notes (Signed)
   PRENATAL VISIT NOTE  Subjective:  Carol Guerrero is a 17 y.o. G1P0000 at 335w2d being seen today for ongoing prenatal care.  She is currently monitored for the following issues for this low-risk pregnancy and has Supervision of normal first pregnancy, antepartum; Chlamydia infection during pregnancy, antepartum; Asymptomatic bacteriuria during pregnancy; and Anemia in pregnancy on their problem list.  Patient reports occasional contractions.  Contractions: Not present. Vag. Bleeding: None.  Movement: Present. Denies leaking of fluid.   The following portions of the patient's history were reviewed and updated as appropriate: allergies, current medications, past family history, past medical history, past social history, past surgical history and problem list. Problem list updated.  Objective:   Vitals:   03/31/18 1326  BP: (!) 110/58  Pulse: (!) 101  Weight: 184 lb (83.5 kg)    Fetal Status: Fetal Heart Rate (bpm): 154 Fundal Height: 39 cm Movement: Present  Presentation: Vertex  General:  Alert, oriented and cooperative. Patient is in no acute distress.  Skin: Skin is warm and dry. No rash noted.   Cardiovascular: Normal heart rate noted  Respiratory: Normal respiratory effort, no problems with respiration noted  Abdomen: Soft, gravid, appropriate for gestational age.  Pain/Pressure: Absent     Pelvic: Cervical exam performed Dilation: Closed Effacement (%): 50 Station: -2 Closed  Extremities: Normal range of motion.  Edema: None  Mental Status: Normal mood and affect. Normal behavior. Normal judgment and thought content.   Assessment and Plan:  Pregnancy: G1P0000 at 475w2d  1. Supervision of normal first pregnancy, antepartum -List of peds given; patient will bring to next appt.  -Reviewed warning signs of labor -Induction scheduled; IOL orders placed.   Term labor symptoms and general obstetric precautions including but not limited to vaginal bleeding, contractions, leaking of  fluid and fetal movement were reviewed in detail with the patient. Please refer to After Visit Summary for other counseling recommendations.  Return please schedule patient for NST/AFI next  week on 11/20 or 11/21, keep appt with CNM on 11-19.  Future Appointments  Date Time Provider Department Center  04/07/2018  1:15 PM Marylene LandKooistra, Kathryn Lorraine, CNM [facility]  04/08/2018  2:15 PM [facility] NST [facility]  04/12/2018 12:00 AM [facility]ED ROOM [facility] None    Marylene LandKathryn Lorraine Kooistra, [place]

## 2018-04-02 ENCOUNTER — Telehealth (HOSPITAL_COMMUNITY): Payer: Self-pay | Admitting: *Deleted

## 2018-04-02 ENCOUNTER — Encounter (HOSPITAL_COMMUNITY): Payer: Self-pay | Admitting: *Deleted

## 2018-04-02 NOTE — Telephone Encounter (Signed)
Preadmission screen  

## 2018-04-03 ENCOUNTER — Telehealth: Payer: Self-pay | Admitting: Licensed Clinical Social Worker

## 2018-04-03 NOTE — Telephone Encounter (Signed)
Called pt regarding schedule appt. Pt confirmed appt

## 2018-04-05 ENCOUNTER — Encounter (HOSPITAL_COMMUNITY): Payer: Self-pay | Admitting: *Deleted

## 2018-04-05 ENCOUNTER — Inpatient Hospital Stay (HOSPITAL_COMMUNITY)
Admission: AD | Admit: 2018-04-05 | Discharge: 2018-04-05 | Disposition: A | Discharge status: Home / Self Care | Payer: Medicaid Other | Source: Ambulatory Visit | Attending: Family Medicine | Admitting: Family Medicine

## 2018-04-05 DIAGNOSIS — Z3A40 40 weeks gestation of pregnancy: Secondary | ICD-10-CM | POA: Insufficient documentation

## 2018-04-05 DIAGNOSIS — O479 False labor, unspecified: Secondary | ICD-10-CM

## 2018-04-05 NOTE — MAU Provider Note (Signed)
Pt here for RN term labor check. Denies LOF, VB. Pos FM.   Patient Vitals for the past 24 hrs:  BP Temp Temp src Pulse Resp Height Weight  04/05/18 1453 114/61 98.7 F (37.1 C) Oral 93 16 5\' 5"  (1.651 m) 83 kg   Dilation: 1 Effacement (%): 50 Cervical Position: Posterior Station: -2 Presentation: Vertex Exam by:: Exie ParodyA. Gagliardo, RN  I reviewed the NST and agree with the nursing assessment.  EFM: Baseline: 150 bpm, Variability: Good {> 6 bpm), Accelerations: Reactive and Decelerations: Absent Toco: irregular, mild  Assessment False labor  Plan D/C home F/U as scheduled w/ Ob provider or MAU PRN for active labor, ROM, decreased FM or emergencies.    Katrinka BlazingSmith, [place], CNM 04/05/2018 3:53 PM

## 2018-04-05 NOTE — Discharge Instructions (Signed)
Braxton Hicks Contractions °Contractions of the uterus can occur throughout pregnancy, but they are not always a sign that you are in labor. You may have practice contractions called Braxton Hicks contractions. These false labor contractions are sometimes confused with true labor. °What are Braxton Hicks contractions? °Braxton Hicks contractions are tightening movements that occur in the muscles of the uterus before labor. Unlike true labor contractions, these contractions do not result in opening (dilation) and thinning of the cervix. Toward the end of pregnancy (32-34 weeks), Braxton Hicks contractions can happen more often and may become stronger. These contractions are sometimes difficult to tell apart from true labor because they can be very uncomfortable. You should not feel embarrassed if you go to the hospital with false labor. °Sometimes, the only way to tell if you are in true labor is for your health care provider to look for changes in the cervix. The health care provider will do a physical exam and may monitor your contractions. If you are not in true labor, the exam should show that your cervix is not dilating and your water has not broken. °If there are other health problems associated with your pregnancy, it is completely safe for you to be sent home with false labor. You may continue to have Braxton Hicks contractions until you go into true labor. °How to tell the difference between true labor and false labor °True labor °· Contractions last 30-70 seconds. °· Contractions become very regular. °· Discomfort is usually felt in the top of the uterus, and it spreads to the lower abdomen and low back. °· Contractions do not go away with walking. °· Contractions usually become more intense and increase in frequency. °· The cervix dilates and gets thinner. °False labor °· Contractions are usually shorter and not as strong as true labor contractions. °· Contractions are usually irregular. °· Contractions  are often felt in the front of the lower abdomen and in the groin. °· Contractions may go away when you walk around or change positions while lying down. °· Contractions get weaker and are shorter-lasting as time goes on. °· The cervix usually does not dilate or become thin. °Follow these instructions at home: °· Take over-the-counter and prescription medicines only as told by your health care provider. °· Keep up with your usual exercises and follow other instructions from your health care provider. °· Eat and drink lightly if you think you are going into labor. °· If Braxton Hicks contractions are making you uncomfortable: °? Change your position from lying down or resting to walking, or change from walking to resting. °? Sit and rest in a tub of warm water. °? Drink enough fluid to keep your urine pale yellow. Dehydration may cause these contractions. °? Do slow and deep breathing several times an hour. °· Keep all follow-up prenatal visits as told by your health care provider. This is important. °Contact a health care provider if: °· You have a fever. °· You have continuous pain in your abdomen. °Get help right away if: °· Your contractions become stronger, more regular, and closer together. °· You have fluid leaking or gushing from your vagina. °· You pass blood-tinged mucus (bloody show). °· You have bleeding from your vagina. °· You have low back pain that you never had before. °· You feel your baby’s head pushing down and causing pelvic pressure. °· Your baby is not moving inside you as much as it used to. °Summary °· Contractions that occur before labor are called Braxton   Hicks contractions, false labor, or practice contractions. °· Braxton Hicks contractions are usually shorter, weaker, farther apart, and less regular than true labor contractions. True labor contractions usually become progressively stronger and regular and they become more frequent. °· Manage discomfort from Braxton Hicks contractions by  changing position, resting in a warm bath, drinking plenty of water, or practicing deep breathing. °This information is not intended to replace advice given to you by your health care provider. Make sure you discuss any questions you have with your health care provider. °Document Released: 09/19/2016 Document Revised: 09/19/2016 Document Reviewed: 09/19/2016 °Elsevier Interactive Patient Education © 2018 Elsevier Inc. ° °

## 2018-04-05 NOTE — MAU Note (Signed)
Pt started having contractions last night, pain is worse with walking & sitting down.  Has to sit up in bed to go to sleep.  Denies bleeding or LOF.  Reports good fetal movement.  SVE this past week, not dilated.

## 2018-04-07 ENCOUNTER — Ambulatory Visit (INDEPENDENT_AMBULATORY_CARE_PROVIDER_SITE_OTHER): Payer: Medicaid Other | Admitting: Student

## 2018-04-07 VITALS — BP 109/68 | HR 92 | Wt 184.7 lb

## 2018-04-07 DIAGNOSIS — Z3403 Encounter for supervision of normal first pregnancy, third trimester: Secondary | ICD-10-CM

## 2018-04-07 DIAGNOSIS — Z3A40 40 weeks gestation of pregnancy: Secondary | ICD-10-CM

## 2018-04-07 DIAGNOSIS — Z3400 Encounter for supervision of normal first pregnancy, unspecified trimester: Secondary | ICD-10-CM

## 2018-04-07 NOTE — Patient Instructions (Addendum)
-Come to Maternity Admissions at 10 am  On 11-23 (Saturday morning) for Foley Balloon placement.    OUTPATIENT FOLEY BULB INDUCTION OF LABOR:  Information Sheet for Mothers and Family               What's a Foley Bulb Induction? A Foley bulb induction is a procedure where your provider inserts a catheter into your cervix. Once inside your womb, your provider inflates the balloon with a saline solution.   This puts pressure on your cervix and encourages dilation. The catheter falls out once your cervix dilates to 3-4 centimeters.     With any procedure, it's important that you know what to expect. The insertion of a Foley catheter can be a bit uncomfortable, and some women experience sharp pelvic pain. The pain may subside once the catheter is in place. You may experience some cramping when the Foley catheter is in place.  This is normal.     GO TO THE MATERNITY ADMISSIONS UNIT FOR THE FOLLOWING:  Heavy vaginal bleeding  Rupture of membranes (fluid that wets your underwear)  Painful uterine contractions every 5 minutes or less  Severe abdominal discomfort  Decreased movement of the baby   AREA PEDIATRIC/FAMILY PRACTICE PHYSICIANS  [facility] [address] [place], [place]  [address] Phone - [phone]   Fax - [phone]  [facility] [address] [place], [place] [address] Phone - [phone]   Fax - [phone]  [facility] [address] [place], [place]  [address] Phone - [phone]   Fax - [phone]  [facility] [address] [place], [place]  [address] Phone - [phone]   Fax - [phone]  [facility] [address] [place], [place]  [address] Phone - [phone]   Fax - [phone]  [facility] [address], [address] [place], [place]  [address] Phone - [phone]   Fax - [phone]  [facility] [address], [address] [place], [place]  [address] Phone - [phone]   Fax - [phone]  [facility] [address] [place], [place]  [address] Phone - [phone]   Fax - [phone]  [facility] [address] [place], [place]  [address] Phone - [phone]   Fax - [phone] [facility] [address] [place], [place]  [address] Phone - [phone]   Fax - [phone]  [facility] [address] [place], [place]  [address] Phone - [phone]   Fax - [phone]  [facility] [address], [address] [place], [place]  [address] Phone - [phone]   Fax - [phone]  [facility] [address] [place], [place]  [address] Phone - [phone]   Fax - [phone]  [facility] [address], [address] [place], [place]  [address] Phone - [phone]  [facility] [address] , [place]  [address] Phone - [phone]   Fax - [phone]  [facility] [address], [address] [place], [place]  [address] Phone - [phone]   Fax - [phone]  [facility] [address] [place], [place]  [address] Phone - [phone]   Fax - [phone]   [facility] [address] [place], [place]  [address] Phone - [phone]   Fax - [phone]  [facility] [address] [place], [place]  [address] Phone - [phone]   Fax - [phone]  [facility] [address] [place], [place]  [address] Phone - [phone]   Fax - [phone]  DAVID RUBIN [address] [place], [place]  [address] Phone - [phone]   Fax - [phone]  [facility] [address] [place], [place]  [address] Phone - [phone]   Fax -  [phone]  [facility] Al[facility] Chyle [address] [place], [place]  [address] Phone - [phone]   Fax - [phone] Gerarda Fraction [address] [place], [place]  [address] Phone - [phone]   Fax - [phone]  [facility] [address] [place], [place]  [address] Phone - [phone]   Fax - [phone]  [facility] [place] [address] [place], [place]  [address] Phone - [phone]   Fax - [phone]  [facility] [place] Wyvonne Lenz MD [address] [place] [address] Phone [phone]  Fax [phone]

## 2018-04-07 NOTE — Progress Notes (Addendum)
   PRENATAL VISIT NOTE  Subjective:  Carol Guerrero is a 17 y.o. G1P0000 at 924w2d being seen today for ongoing prenatal care.  She is currently monitored for the following issues for this low-risk pregnancy and has Supervision of normal first pregnancy, antepartum; Chlamydia infection during pregnancy, antepartum; Asymptomatic bacteriuria during pregnancy; and Anemia in pregnancy on their problem list.  Patient reports occasional contractions. She was seen and evaluated in MAU on Sunday; was one CM. Declines cervical exam today. She feels that the baby is lower in the pelvis and has dropped.   Contractions: Not present. Vag. Bleeding: None.  Movement: Present. Denies leaking of fluid.   The following portions of the patient's history were reviewed and updated as appropriate: allergies, current medications, past family history, past medical history, past social history, past surgical history and problem list. Problem list updated.  Objective:   Vitals:   04/07/18 1322  BP: 109/68  Pulse: 92  Weight: 184 lb 11.2 oz (83.8 kg)    Fetal Status: Fetal Heart Rate (bpm): 140 Fundal Height: 37 cm Movement: Present     General:  Alert, oriented and cooperative. Patient is in no acute distress.  Skin: Skin is warm and dry. No rash noted.   Cardiovascular: Normal heart rate noted  Respiratory: Normal respiratory effort, no problems with respiration noted  Abdomen: Soft, gravid, appropriate for gestational age.  Pain/Pressure: Absent     Pelvic: Cervical exam deferred        Extremities: Normal range of motion.  Edema: None  Mental Status: Normal mood and affect. Normal behavior. Normal judgment and thought content.   Assessment and Plan:  Pregnancy: G1P0000 at 6924w2d   1. Supervision of normal first pregnancy, antepartum    -Patient to meet with Veto KempsAndrea LCSW today -Patient to come to MAU on Saturday morning for FB placement; explained process to patient. Patient verbalized understanding and  will come for FB placement.   Term labor symptoms and general obstetric precautions including but not limited to vaginal bleeding, contractions, leaking of fluid and fetal movement were reviewed in detail with the patient. Please refer to After Visit Summary for other counseling recommendations.  Return in about 1 day (around 04/08/2018), or tomorrow for NST.  Future Appointments  Date Time Provider Department Center  04/08/2018  2:15 PM WOC-WOCA NST WOC-WOCA WOC  04/12/2018 12:00 AM WH-BSSCHED ROOM WH-BSSCHED None    Marylene LandKathryn Lorraine Conni Knighton, [place]

## 2018-04-08 ENCOUNTER — Ambulatory Visit (INDEPENDENT_AMBULATORY_CARE_PROVIDER_SITE_OTHER): Payer: Medicaid Other | Admitting: *Deleted

## 2018-04-08 ENCOUNTER — Ambulatory Visit: Payer: Self-pay

## 2018-04-08 VITALS — BP 115/64 | HR 102 | Wt 185.0 lb

## 2018-04-08 DIAGNOSIS — O480 Post-term pregnancy: Secondary | ICD-10-CM

## 2018-04-08 NOTE — Progress Notes (Signed)

## 2018-04-09 NOTE — Progress Notes (Signed)
Subjective: Carol Guerrero is a G1P0000 at 2657w4d who [facility].  She does not have a history of any mental health concerns. She is not currently sexually active. She is currently using no method for birth control. Patient states mother as her support system.   BP 109/68   Pulse 92   Wt 184 lb 11.2 oz (83.8 kg)   LMP 06/29/2017   BMI 30.74 kg/m   Birth Control History:  Condoms   MDM Patient counseled on all options for birth control today including LARC. Patient desires nexplanon prior to hospital discharge initiated for birth control.   Assessment:  17 y.o. female desires inpatient nexplanon for birth control  Plan: Patient reports she is scheduled to be induced 04/12/2018  Gwyndolyn SaxonFigueroa, Tabathia Knoche, LCSW 04/09/2018 9:53 AM

## 2018-04-09 NOTE — Progress Notes (Signed)
  NST:  Baseline: 150 bpm, Variability: Good {> 6 bpm), Accelerations: Reactive and Decelerations: Absent   Patient seen and assessed by nursing staff.  Agree with documentation and plan.

## 2018-04-10 ENCOUNTER — Inpatient Hospital Stay (HOSPITAL_COMMUNITY)
Admission: AD | Admit: 2018-04-10 | Discharge: 2018-04-12 | DRG: 807 | Disposition: A | Discharge status: Home / Self Care | Payer: Medicaid Other | Source: Ambulatory Visit | Attending: Obstetrics and Gynecology | Admitting: Obstetrics and Gynecology

## 2018-04-10 ENCOUNTER — Inpatient Hospital Stay (HOSPITAL_COMMUNITY): Payer: Medicaid Other | Admitting: Anesthesiology

## 2018-04-10 ENCOUNTER — Other Ambulatory Visit: Payer: Self-pay

## 2018-04-10 ENCOUNTER — Encounter (HOSPITAL_COMMUNITY): Payer: Self-pay

## 2018-04-10 DIAGNOSIS — O9989 Other specified diseases and conditions complicating pregnancy, childbirth and the puerperium: Secondary | ICD-10-CM

## 2018-04-10 DIAGNOSIS — Z30017 Encounter for initial prescription of implantable subdermal contraceptive: Secondary | ICD-10-CM | POA: Diagnosis not present

## 2018-04-10 DIAGNOSIS — O4593 Premature separation of placenta, unspecified, third trimester: Principal | ICD-10-CM | POA: Diagnosis present

## 2018-04-10 DIAGNOSIS — Z3400 Encounter for supervision of normal first pregnancy, unspecified trimester: Secondary | ICD-10-CM

## 2018-04-10 DIAGNOSIS — O4590 Premature separation of placenta, unspecified, unspecified trimester: Secondary | ICD-10-CM | POA: Diagnosis present

## 2018-04-10 DIAGNOSIS — O9902 Anemia complicating childbirth: Secondary | ICD-10-CM | POA: Diagnosis present

## 2018-04-10 DIAGNOSIS — O99824 Streptococcus B carrier state complicating childbirth: Secondary | ICD-10-CM

## 2018-04-10 DIAGNOSIS — O480 Post-term pregnancy: Secondary | ICD-10-CM | POA: Diagnosis not present

## 2018-04-10 DIAGNOSIS — Z3A40 40 weeks gestation of pregnancy: Secondary | ICD-10-CM

## 2018-04-10 DIAGNOSIS — O98819 Other maternal infectious and parasitic diseases complicating pregnancy, unspecified trimester: Secondary | ICD-10-CM

## 2018-04-10 DIAGNOSIS — R8271 Bacteriuria: Secondary | ICD-10-CM

## 2018-04-10 DIAGNOSIS — O99019 Anemia complicating pregnancy, unspecified trimester: Secondary | ICD-10-CM | POA: Diagnosis present

## 2018-04-10 DIAGNOSIS — O99891 Other specified diseases and conditions complicating pregnancy: Secondary | ICD-10-CM

## 2018-04-10 DIAGNOSIS — A749 Chlamydial infection, unspecified: Secondary | ICD-10-CM | POA: Diagnosis present

## 2018-04-10 LAB — CBC
HCT: 30.8 % — ABNORMAL LOW (ref 36.0–46.0)
Hemoglobin: 10.1 g/dL — ABNORMAL LOW (ref 12.0–15.0)
MCH: 32.4 pg (ref 26.0–34.0)
MCHC: 32.8 g/dL (ref 30.0–36.0)
MCV: 98.7 fL (ref 80.0–100.0)
PLATELETS: 251 10*3/uL (ref 150–400)
RBC: 3.12 MIL/uL — AB (ref 3.87–5.11)
RDW: 12.7 % (ref 11.5–15.5)
WBC: 17.1 10*3/uL — ABNORMAL HIGH (ref 4.0–10.5)
nRBC: 0.1 % (ref 0.0–0.2)

## 2018-04-10 LAB — TYPE AND SCREEN
ABO/RH(D): A POS
Antibody Screen: NEGATIVE

## 2018-04-10 LAB — ABO/RH: ABO/RH(D): A POS

## 2018-04-10 MED ORDER — OXYCODONE HCL 5 MG PO TABS: 5.0000 mg | ORAL_TABLET | ORAL | Status: DC | PRN

## 2018-04-10 MED ORDER — FENTANYL CITRATE (PF) 100 MCG/2ML IJ SOLN
100.0000 ug | INTRAMUSCULAR | Status: DC | PRN
  Administered 2018-04-10: 100 ug via INTRAVENOUS

## 2018-04-10 MED ORDER — OXYCODONE-ACETAMINOPHEN 5-325 MG PO TABS: 1.0000 | ORAL_TABLET | ORAL | Status: DC | PRN

## 2018-04-10 MED ORDER — FENTANYL 2.5 MCG/ML BUPIVACAINE 1/10 % EPIDURAL INFUSION (WH - ANES)
14.0000 mL/h | INTRAMUSCULAR | Status: DC | PRN
  Administered 2018-04-10: 14 mL/h via EPIDURAL
  Filled 2018-04-10: qty 100

## 2018-04-10 MED ORDER — SENNOSIDES-DOCUSATE SODIUM 8.6-50 MG PO TABS
2.0000 | ORAL_TABLET | ORAL | Status: DC
  Administered 2018-04-11 (×2): 2 via ORAL
  Filled 2018-04-10 (×2): qty 2

## 2018-04-10 MED ORDER — WITCH HAZEL-GLYCERIN EX PADS: 1.0000 "application " | MEDICATED_PAD | CUTANEOUS | Status: DC | PRN

## 2018-04-10 MED ORDER — OXYTOCIN BOLUS FROM INFUSION
500.0000 mL | Freq: Once | INTRAVENOUS | Status: AC
  Administered 2018-04-10: 500 mL via INTRAVENOUS

## 2018-04-10 MED ORDER — LIDOCAINE HCL (PF) 1 % IJ SOLN
INTRAMUSCULAR | Status: DC | PRN
  Administered 2018-04-10: 5 mL via EPIDURAL

## 2018-04-10 MED ORDER — PHENYLEPHRINE 40 MCG/ML (10ML) SYRINGE FOR IV PUSH (FOR BLOOD PRESSURE SUPPORT)
80.0000 ug | PREFILLED_SYRINGE | INTRAVENOUS | Status: DC | PRN
  Filled 2018-04-10: qty 5

## 2018-04-10 MED ORDER — LACTATED RINGERS IV SOLN
500.0000 mL | INTRAVENOUS | Status: DC | PRN
  Administered 2018-04-10: 1000 mL via INTRAVENOUS

## 2018-04-10 MED ORDER — BENZOCAINE-MENTHOL 20-0.5 % EX AERO
1.0000 "application " | INHALATION_SPRAY | CUTANEOUS | Status: DC | PRN
  Administered 2018-04-11: 1 via TOPICAL
  Filled 2018-04-10: qty 56

## 2018-04-10 MED ORDER — DIPHENHYDRAMINE HCL 25 MG PO CAPS: 25.0000 mg | ORAL_CAPSULE | Freq: Four times a day (QID) | ORAL | Status: DC | PRN

## 2018-04-10 MED ORDER — OXYCODONE-ACETAMINOPHEN 5-325 MG PO TABS: 2.0000 | ORAL_TABLET | ORAL | Status: DC | PRN

## 2018-04-10 MED ORDER — IBUPROFEN 600 MG PO TABS
600.0000 mg | ORAL_TABLET | Freq: Four times a day (QID) | ORAL | Status: DC
  Administered 2018-04-11 – 2018-04-12 (×8): 600 mg via ORAL
  Filled 2018-04-10 (×9): qty 1

## 2018-04-10 MED ORDER — ACETAMINOPHEN 325 MG PO TABS: 650.0000 mg | ORAL_TABLET | ORAL | Status: DC | PRN

## 2018-04-10 MED ORDER — LACTATED RINGERS IV SOLN: 500.0000 mL | Freq: Once | INTRAVENOUS | Status: DC

## 2018-04-10 MED ORDER — LACTATED RINGERS IV SOLN: INTRAVENOUS | Status: DC

## 2018-04-10 MED ORDER — PHENYLEPHRINE 40 MCG/ML (10ML) SYRINGE FOR IV PUSH (FOR BLOOD PRESSURE SUPPORT): 80.0000 ug | PREFILLED_SYRINGE | INTRAVENOUS | Status: DC | PRN

## 2018-04-10 MED ORDER — PRENATAL MULTIVITAMIN CH
1.0000 | ORAL_TABLET | Freq: Every day | ORAL | Status: DC
  Administered 2018-04-11 – 2018-04-12 (×2): 1 via ORAL
  Filled 2018-04-10 (×2): qty 1

## 2018-04-10 MED ORDER — ACETAMINOPHEN 325 MG PO TABS
650.0000 mg | ORAL_TABLET | ORAL | Status: DC | PRN
  Administered 2018-04-11: 650 mg via ORAL
  Filled 2018-04-10: qty 2

## 2018-04-10 MED ORDER — DIBUCAINE 1 % RE OINT: 1.0000 "application " | TOPICAL_OINTMENT | RECTAL | Status: DC | PRN

## 2018-04-10 MED ORDER — ONDANSETRON HCL 4 MG PO TABS: 4.0000 mg | ORAL_TABLET | ORAL | Status: DC | PRN

## 2018-04-10 MED ORDER — ONDANSETRON HCL 4 MG/2ML IJ SOLN: 4.0000 mg | Freq: Four times a day (QID) | INTRAMUSCULAR | Status: DC | PRN

## 2018-04-10 MED ORDER — FLEET ENEMA 7-19 GM/118ML RE ENEM: 1.0000 | ENEMA | RECTAL | Status: DC | PRN

## 2018-04-10 MED ORDER — LIDOCAINE HCL (PF) 1 % IJ SOLN
30.0000 mL | INTRAMUSCULAR | Status: AC | PRN
  Administered 2018-04-10: 30 mL via SUBCUTANEOUS
  Filled 2018-04-10: qty 30

## 2018-04-10 MED ORDER — ONDANSETRON HCL 4 MG/2ML IJ SOLN: 4.0000 mg | INTRAMUSCULAR | Status: DC | PRN

## 2018-04-10 MED ORDER — PHENYLEPHRINE 40 MCG/ML (10ML) SYRINGE FOR IV PUSH (FOR BLOOD PRESSURE SUPPORT)
80.0000 ug | PREFILLED_SYRINGE | INTRAVENOUS | Status: DC | PRN
  Filled 2018-04-10: qty 5
  Filled 2018-04-10: qty 10

## 2018-04-10 MED ORDER — TETANUS-DIPHTH-ACELL PERTUSSIS 5-2.5-18.5 LF-MCG/0.5 IM SUSP: 0.5000 mL | Freq: Once | INTRAMUSCULAR | Status: DC

## 2018-04-10 MED ORDER — DIPHENHYDRAMINE HCL 50 MG/ML IJ SOLN: 12.5000 mg | INTRAMUSCULAR | Status: DC | PRN

## 2018-04-10 MED ORDER — SIMETHICONE 80 MG PO CHEW: 80.0000 mg | CHEWABLE_TABLET | ORAL | Status: DC | PRN

## 2018-04-10 MED ORDER — EPHEDRINE 5 MG/ML INJ
10.0000 mg | INTRAVENOUS | Status: DC | PRN
  Filled 2018-04-10: qty 2

## 2018-04-10 MED ORDER — SODIUM CHLORIDE 0.9 % IV SOLN: 5.0000 10*6.[IU] | Freq: Once | INTRAVENOUS | Status: DC

## 2018-04-10 MED ORDER — OXYTOCIN 40 UNITS IN LACTATED RINGERS INFUSION - SIMPLE MED
2.5000 [IU]/h | INTRAVENOUS | Status: DC
  Filled 2018-04-10: qty 1000

## 2018-04-10 MED ORDER — SOD CITRATE-CITRIC ACID 500-334 MG/5ML PO SOLN: 30.0000 mL | ORAL | Status: DC | PRN

## 2018-04-10 MED ORDER — SODIUM CHLORIDE 0.9 % IV SOLN
2.0000 g | Freq: Once | INTRAVENOUS | Status: AC
  Administered 2018-04-10: 2 g via INTRAVENOUS
  Filled 2018-04-10: qty 2

## 2018-04-10 MED ORDER — PENICILLIN G 3 MILLION UNITS IVPB - SIMPLE MED: 3.0000 10*6.[IU] | INTRAVENOUS | Status: DC

## 2018-04-10 MED ORDER — COCONUT OIL OIL: 1.0000 "application " | TOPICAL_OIL | Status: DC | PRN

## 2018-04-10 MED ORDER — FENTANYL CITRATE (PF) 100 MCG/2ML IJ SOLN
INTRAMUSCULAR | Status: AC
  Filled 2018-04-10: qty 2

## 2018-04-10 MED ORDER — EPHEDRINE 5 MG/ML INJ: 10.0000 mg | INTRAVENOUS | Status: DC | PRN

## 2018-04-10 MED ORDER — ZOLPIDEM TARTRATE 5 MG PO TABS: 5.0000 mg | ORAL_TABLET | Freq: Every evening | ORAL | Status: DC | PRN

## 2018-04-10 NOTE — Anesthesia Procedure Notes (Addendum)
Epidural Patient location during procedure: OB Start time: 04/10/2018 5:55 PM End time: 04/10/2018 6:07 PM  Staffing Anesthesiologist: Trevor IhaHouser, Jaysiah Marchetta A, MD Performed: anesthesiologist   Preanesthetic Checklist Completed: patient identified, site marked, surgical consent, pre-op evaluation, timeout performed, IV checked, risks and benefits discussed and monitors and equipment checked  Epidural Patient position: sitting Prep: site prepped and draped and DuraPrep Patient monitoring: continuous pulse ox and blood pressure Approach: midline Location: L3-L4 Injection technique: LOR air  Needle:  Needle type: Tuohy  Needle gauge: 17 G Needle length: 9 cm and 9 Needle insertion depth: 5 cm cm Catheter type: closed end flexible Catheter size: 19 Gauge Catheter at skin depth: 10 cm Test dose: negative  Assessment Events: blood not aspirated, injection not painful, no injection resistance, negative IV test and no paresthesia  Additional Notes 1 attempt. Pt tolerated procedure well.

## 2018-04-10 NOTE — Anesthesia Preprocedure Evaluation (Signed)
Anesthesia Evaluation  Patient identified by MRN, date of birth, ID band Patient awake    Reviewed: Allergy & Precautions, NPO status , Patient's Chart, lab work & pertinent test results  Airway Mallampati: II  TM Distance: >3 FB Neck ROM: Full    Dental no notable dental hx. (+) Teeth Intact   Pulmonary neg pulmonary ROS,    Pulmonary exam normal breath sounds clear to auscultation       Cardiovascular Exercise Tolerance: Good negative cardio ROS Normal cardiovascular exam Rhythm:Regular Rate:Normal     Neuro/Psych negative neurological ROS     GI/Hepatic negative GI ROS, Neg liver ROS,   Endo/Other    Renal/GU negative Renal ROS     Musculoskeletal   Abdominal   Peds  Hematology  (+) Blood dyscrasia, anemia ,   Anesthesia Other Findings   Reproductive/Obstetrics (+) Pregnancy                             Lab Results  Component Value Date   WBC 17.1 (H) 04/10/2018   HGB 10.1 (L) 04/10/2018   HCT 30.8 (L) 04/10/2018   MCV 98.7 04/10/2018   PLT 251 04/10/2018    Anesthesia Physical Anesthesia Plan  ASA: II  Anesthesia Plan: Epidural   Post-op Pain Management:    Induction:   PONV Risk Score and Plan:   Airway Management Planned:   Additional Equipment:   Intra-op Plan:   Post-operative Plan:   Informed Consent: I have reviewed the patients History and Physical, chart, labs and discussed the procedure including the risks, benefits and alternatives for the proposed anesthesia with the patient or authorized representative who has indicated his/her understanding and acceptance.     Plan Discussed with: CRNA  Anesthesia Plan Comments:         Anesthesia Quick Evaluation

## 2018-04-10 NOTE — MAU Provider Note (Signed)
First Provider Initiated Contact with Patient 04/10/18 1605     S: Ms. Carol Guerrero is a 17 y.o. G1P0000 at 6565w5d  who presents to MAU today complaining contractions q 2 minutes since 1200. She endorses vaginal bleeding. She denies LOF. She reports normal fetal movement.    O: BP 121/76 (BP Location: Right Arm)   Pulse 98   Temp 98.4 F (36.9 C) (Oral)   Resp 20   Wt 84.8 kg   LMP 06/29/2017   SpO2 98%   BMI 31.12 kg/m  GENERAL: Well-developed, well-nourished female in no acute distress.  HEAD: Normocephalic, atraumatic.  CHEST: Normal effort of breathing, regular heart rate ABDOMEN: Soft, nontender, gravid  Cervical exam:  Dilation: 1 Effacement (%): 100 Cervical Position: Posterior Station: -1 Presentation: Vertex Exam by:: Gursimran Litaker,CNM   Fetal Monitoring: Baseline: 140 Variability: moderate Accelerations: 15x15 Decelerations: none Contractions: 2-3  SSE: small amount of bright red bleeding in vault.  Moderate amount of bright red bleeding on perineum and inner thighs  A: SIUP at 8765w5d  Active labor  P: Admit to labor and delivery - Report called to labor team and orders placed  Rolm Bookbindereill, Yaretzi Ernandez M, [place] 04/10/2018 4:19 PM

## 2018-04-10 NOTE — H&P (Signed)
Carol Guerrero is a 17 y.o. female G1 @ 40.5wks by LMP presenting for eval of ctx, and then having significant bldg in MAU. Denies leaking fluid. Her preg has been remarkable for 1) +chlam in 2nd tri with neg TOC 2) iron def anemia 3) GBS +  OB History    Gravida  1   Para  0   Term  0   Preterm  0   AB  0   Living  0     SAB  0   TAB  0   Ectopic  0   Multiple  0   Live Births  0          Past Medical History:  Diagnosis Date  . Medical history non-contributory    Past Surgical History:  Procedure Laterality Date  . NO PAST SURGERIES     Family History: family history is not on file. Social History:  reports that she has never smoked. She has never used smokeless tobacco. She reports that she drank alcohol. She reports that she has current or past drug history.     Maternal Diabetes: No Genetic Screening: Normal Maternal Ultrasounds/Referrals: Normal Fetal Ultrasounds or other Referrals:  None Maternal Substance Abuse:  No Significant Maternal Medications:  None Significant Maternal Lab Results:  Lab values include: Group B Strep positive Other Comments:  None  ROS History Dilation: 10 Effacement (%): 100 Station: Plus 2 Exam by:: wilkins rnc Blood pressure (!) 117/59, pulse 83, temperature 98.2 F (36.8 C), temperature source Oral, resp. rate (!) 22, weight 84.8 kg, last menstrual period 06/29/2017, SpO2 98 %. Exam Physical Exam  Constitutional: She is oriented to person, place, and time. She appears well-developed.  HENT:  Head: Normocephalic.  Neck: Normal range of motion.  Cardiovascular: Normal rate.  Respiratory: Effort normal.  GI:  EFM 140s, +accels, early variables Ctx q 2-4 mins, spont  Genitourinary:  Genitourinary Comments: + bldy show  Musculoskeletal: Normal range of motion.  Neurological: She is alert and oriented to person, place, and time.  Skin: Skin is warm and dry.  Psychiatric: She has a normal mood and affect. Her  behavior is normal. Thought content normal.    Prenatal labs: ABO, Rh: --/--/A POS, A POS Performed at [facility], [address] [place], [place] [address]  [phone](11/22 1624) Antibody: NEG (11/22 1624) Rubella: 13.60 (05/13 1110) RPR: Non Reactive (09/03 0900)  HBsAg: Negative (05/13 1110)  HIV: Non Reactive (09/03 0900)  GBS:     Assessment/Plan: IUP@term  Third tri vag bldg with latent labor/probable placental abruption  [facility] Amp for GBS ppx Watch bldg and FHR Anticipate SVD   Arabella MerlesKimberly D Nikkolas Coomes 04/10/2018, 9:08 PM

## 2018-04-10 NOTE — MAU Note (Signed)
Having bad contractions, started last night, got really bad in the past hour. Was 1 cm on Sunday. No bleeding or leaking.

## 2018-04-11 DIAGNOSIS — Z30017 Encounter for initial prescription of implantable subdermal contraceptive: Secondary | ICD-10-CM

## 2018-04-11 LAB — RPR: RPR: NONREACTIVE

## 2018-04-11 MED ORDER — ETONOGESTREL 68 MG ~~LOC~~ IMPL
68.0000 mg | DRUG_IMPLANT | Freq: Once | SUBCUTANEOUS | Status: AC
  Administered 2018-04-11: 68 mg via SUBCUTANEOUS
  Filled 2018-04-11: qty 1

## 2018-04-11 MED ORDER — LIDOCAINE HCL 1 % IJ SOLN
0.0000 mL | Freq: Once | INTRAMUSCULAR | Status: DC | PRN
  Filled 2018-04-11 (×2): qty 20

## 2018-04-11 NOTE — Plan of Care (Signed)
Pt progressing toward meeting goals.

## 2018-04-11 NOTE — Procedures (Addendum)
  HPI:  Carol Guerrero is a 17 y.o. year old African American female requesting Nexplanon insertion. Risks/benefits/side effects of Nexplanon have been discussed and her questions have been answered.  Specifically, a failure rate of 05/998 has been reported, with an increased failure rate if pt takes St. John's Wort and/or antiseizure medicaitons.  Carol Guerrero is aware of the common side effect of irregular bleeding, which the incidence of decreases over time.  Allergies: No Known Allergies  Her left arm, approximatly 4 inches proximal from the elbow, was cleansed with alcohol x2 and anesthetized with 2cc of 2% Lidocaine.  The area was cleansed again with betadine x2 and the Nexplanon was inserted without difficulty.  Area of nexplanon was palpated by both physician and patient to ensure placement. A pressure bandage was applied.  Pt was instructed to remove pressure bandage in a few hours, and keep insertion site covered with a bandaid for 3 days.    Entire procedure was directly supervised by Dr. Sheran Fava Aydon Swamy   Oralia ManisSherin Abraham, DO, PGY-2 04/11/2018 10:09 AM   OB FELLOW  ATTESTATION  I was gloved and present for the procedure in its entirety, and I agree with the above resident's note.    Marcy Sirenatherine Neri Samek, D.O. OB Fellow  04/11/2018, 10:21 AM

## 2018-04-11 NOTE — Lactation Note (Signed)
This note was copied from a baby's chart. Lactation Consultation Note  Patient Name: Carol Drue Flirtlexis Skeens ONGEX'BToday's Date: 04/11/2018 Reason for consult: Initial assessment   P1, Baby 17 hours old.  Unwrapped baby for feeding. Reviewed hand expression with drops expressed. Attempted football position but with cross cradle baby sustained latch longer. Intermittent swallows observed.  Mother states it hurts initially but then pain subsides. Encouraged depth and discussed how to unlatch, check for flanged lips and depth.  Mom encouraged to feed baby 8-12 times/24 hours and with feeding cues.  Discussed basics. Mom made aware of O/P services, breastfeeding support groups, community resources, and our phone # for post-discharge questions.     Maternal Data Has patient been taught Hand Expression?: Yes Does the patient have breastfeeding experience prior to this delivery?: No  Feeding Feeding Type: Breast Fed  LATCH Score Latch: Repeated attempts needed to sustain latch, nipple held in mouth throughout feeding, stimulation needed to elicit sucking reflex.  Audible Swallowing: A few with stimulation  Type of Nipple: Everted at rest and after stimulation  Comfort (Breast/Nipple): Soft / non-tender  Hold (Positioning): Assistance needed to correctly position infant at breast and maintain latch.  LATCH Score: 7  Interventions Interventions: Breast feeding basics reviewed;Assisted with latch;Skin to skin;Hand express;Pre-pump if needed;Breast compression;Adjust position;Support pillows  Lactation Tools Discussed/Used     Consult Status Consult Status: Follow-up Date: 04/12/18 Follow-up type: In-patient    Dahlia ByesBerkelhammer, Esmae Donathan [facility] 04/11/2018, 12:48 PM

## 2018-04-11 NOTE — Anesthesia Postprocedure Evaluation (Signed)
Anesthesia Post Note  Patient: Drue Flirtlexis Jane  Procedure(s) Performed: AN AD HOC LABOR EPIDURAL     Patient location during evaluation: Mother Baby Anesthesia Type: Epidural Level of consciousness: awake and alert Pain management: pain level controlled Vital Signs Assessment: post-procedure vital signs reviewed and stable Respiratory status: spontaneous breathing Cardiovascular status: stable Postop Assessment: no headache, patient able to bend at knees, no backache, no apparent nausea or vomiting, epidural receding, adequate PO intake and able to ambulate Anesthetic complications: no    Last Vitals:  Vitals:   04/11/18 0250 04/11/18 0639  BP: (!) 113/58 (!) 112/58  Pulse: 91 80  Resp: 16 16  Temp: 37 C 37.2 C  SpO2: 99% 98%    Last Pain:  Vitals:   04/11/18 0639  TempSrc: Oral  PainSc: 6    Pain Goal: Patients Stated Pain Goal: 4 (04/10/18 1639)               Salome ArntSterling, Derold Dorsch Marie

## 2018-04-11 NOTE — Progress Notes (Signed)
Post Partum Day #1 Subjective: no complaints, up ad lib and tolerating PO; breastfeeding going well; plans on Nexplanon inpt  Objective: Blood pressure (!) 112/58, pulse 80, temperature 98.9 F (37.2 C), temperature source Oral, resp. rate 16, weight 84.8 kg, last menstrual period 06/29/2017, SpO2 98 %, unknown if currently breastfeeding.  Physical Exam:  General: alert, cooperative and no distress Lochia: appropriate Uterine Fundus: firm DVT Evaluation: No evidence of DVT seen on physical exam.  Recent Labs    04/10/18 1624  HGB 10.1*  HCT 30.8*    Assessment/Plan: Plan for discharge tomorrow  Nexplanon to be placed today   LOS: 1 day   Arabella MerlesKimberly D Shaw CNM 04/11/2018, 7:45 AM

## 2018-04-12 ENCOUNTER — Inpatient Hospital Stay (HOSPITAL_COMMUNITY): Admission: RE | Admit: 2018-04-12 | Payer: Medicaid Other | Source: Ambulatory Visit

## 2018-04-12 MED ORDER — IBUPROFEN 600 MG PO TABS: 600.0000 mg | ORAL_TABLET | Freq: Four times a day (QID) | ORAL | 0 refills | Status: DC

## 2018-04-12 NOTE — Lactation Note (Signed)
This note was copied from a baby's chart. Lactation Consultation Note  Patient Name: Carol Guerrero MVHQI'OToday's Date: 04/12/2018 Reason for consult: Follow-up assessment   P1, Baby 39 hours old. 17 year old Mother states she wants to formula feed. Reviewed engorgement care and cabbage leaves application. Suggest mother call if she needs further information.     Maternal Data    Feeding    LATCH Score                   Interventions    Lactation Tools Discussed/Used     Consult Status Consult Status: Complete Date: 04/12/18    Dahlia ByesBerkelhammer,  [facility] 04/12/2018, 10:15 AM

## 2018-04-14 NOTE — Discharge Summary (Signed)
Postpartum Discharge Summary     Patient Name: Carol Guerrero DOB: 25-Dec-2000 MRN: [id number]  Date of admission: 04/10/2018 Delivering Provider: Edd ArbourWALKER, JAMILLA R   Date of discharge: 04/14/2018  Admitting diagnosis: 40 WKS, CTX Intrauterine pregnancy: 5546w5d     Secondary diagnosis:  Active Problems:   Supervision of normal first pregnancy, antepartum   Chlamydia infection during pregnancy, antepartum   Asymptomatic bacteriuria during pregnancy   Anemia in pregnancy   Normal labor   Placenta abruption, delivered, current hospitalization   SVD (spontaneous vaginal delivery)  Additional problems None     Discharge diagnosis: Term Pregnancy Delivered                                                                                                Post partum procedures:none  Augmentation: none  Complications: None  Hospital course:  Onset of Labor With Vaginal Delivery     17 y.o. yo G1P1001 at 4146w5d was admitted in Active Labor on 04/10/2018. Patient had an uncomplicated labor course as follows:  Membrane Rupture Time/Date: 4:56 PM ,04/10/2018   Intrapartum Procedures: Episiotomy: None [1]                                         Lacerations:  1st degree [2];Perineal [11];Labial [10]  Patient had a delivery of a Viable infant. 04/10/2018  Information for the patient's newborn:  Monte FantasiaKoch, Nova Ava'rose [[id number]][[id number]]  Delivery Method: Vag-Spont    Pateint had an uncomplicated postpartum course.  She is ambulating, tolerating a regular diet, passing flatus, and urinating well. Patient is discharged home in stable condition on 04/14/18.   Magnesium Sulfate recieved: No BMZ received: No  Physical exam  Vitals:   04/11/18 1040 04/11/18 1427 04/11/18 2345 04/12/18 0529  BP: 123/66 (!) 107/54 (!) 115/56 106/61  Pulse: (!) 103 (!) 101 97 73  Resp: 20 16  18   Temp: 98.5 F (36.9 C) 98.5 F (36.9 C) 98.5 F (36.9 C) 98.2 F (36.8 C)  TempSrc: Oral Oral Oral Oral  SpO2:  98%  97% 99%  Weight:       General: alert, cooperative and no distress Lochia: appropriate Uterine Fundus: firm Incision: N/A DVT Evaluation: No evidence of DVT seen on physical exam. Labs: Lab Results  Component Value Date   WBC 17.1 (H) 04/10/2018   HGB 10.1 (L) 04/10/2018   HCT 30.8 (L) 04/10/2018   MCV 98.7 04/10/2018   PLT 251 04/10/2018   CMP Latest Ref Rng & Units 08/13/2017  Glucose 65 - 99 mg/dL 89  BUN 6 - 20 mg/dL 10  Creatinine 8.650.44 - 7.841.00 mg/dL 6.960.73  Sodium 295135 - 284145 mmol/L 136  Potassium 3.5 - 5.1 mmol/L 3.5  Chloride 101 - 111 mmol/L 104  CO2 22 - 32 mmol/L 21(L)  Calcium 8.9 - 10.3 mg/dL 9.3  Total Protein 6.5 - 8.1 g/dL 7.7  Total Bilirubin 0.3 - 1.2 mg/dL 1.3(K1.7(H)  Alkaline Phos 38 - 126 U/L 71  AST 15 - 41  U/L 30  ALT 14 - 54 U/L 28    Discharge instruction: per After Visit Summary and "Baby and Me Booklet".  After visit meds:  Allergies as of 04/12/2018   No Known Allergies     Medication List    TAKE these medications   ibuprofen 600 MG tablet Commonly known as:  ADVIL,MOTRIN Take 1 tablet (600 mg total) by mouth every 6 (six) hours.       Diet: routine diet  Activity: Advance as tolerated. Pelvic rest for 6 weeks.   Outpatient follow up:6 weeks Follow up Appt: Future Appointments  Date Time Provider Department Center  05/26/2018  1:15 PM Gwenevere Abbot, MD [facility]   Follow up Visit:   Please schedule this patient for Postpartum visit in: 6 weeks with the following provider: Lab only For C/S patients schedule nurse incision check in weeks 2 weeks: no Low risk pregnancy complicated by: none Delivery mode:  SVD Anticipated Birth Control:  Nexplanon placed in hospital PP Procedures needed: none  Schedule Integrated BH visit: yes      Newborn Data: Live born female  Birth Weight: 6 lb 13.4 oz (3100 g) APGAR: 8, 9  Newborn Delivery   Birth date/time:  04/10/2018 19:12:00 Delivery type:  Vaginal, Spontaneous     Baby  Feeding: Breast Disposition:home with mother   04/14/2018 Sharen Counter, CNM

## 2018-05-22 ENCOUNTER — Telehealth: Payer: Self-pay | Admitting: Licensed Clinical Social Worker

## 2018-05-22 NOTE — Telephone Encounter (Signed)
Pt confirm appt. 

## 2018-05-26 ENCOUNTER — Encounter: Payer: Self-pay | Admitting: *Deleted

## 2018-05-26 ENCOUNTER — Ambulatory Visit: Payer: Medicaid Other | Admitting: Family Medicine

## 2018-06-01 ENCOUNTER — Ambulatory Visit (INDEPENDENT_AMBULATORY_CARE_PROVIDER_SITE_OTHER): Payer: Medicaid Other | Admitting: Family Medicine

## 2018-06-01 DIAGNOSIS — Z1389 Encounter for screening for other disorder: Secondary | ICD-10-CM

## 2018-06-01 DIAGNOSIS — Z975 Presence of (intrauterine) contraceptive device: Secondary | ICD-10-CM

## 2018-06-01 DIAGNOSIS — N921 Excessive and frequent menstruation with irregular cycle: Secondary | ICD-10-CM

## 2018-06-01 MED ORDER — MEDROXYPROGESTERONE ACETATE 10 MG PO TABS: 10.0000 mg | ORAL_TABLET | Freq: Every day | ORAL | 6 refills | Status: DC

## 2018-06-01 NOTE — Progress Notes (Signed)
Subjective:     Carol Guerrero is a 18 y.o. female who presents for a postpartum visit. She is 7 weeks postpartum following a spontaneous vaginal delivery. I have fully reviewed the prenatal and intrapartum course. The delivery was at 40 gestational weeks. Outcome: spontaneous vaginal delivery. Anesthesia: epidural. Postpartum course has been normal. Baby's course has been normal. Baby is feeding by bottle Rush Barer Soothe. Bleeding no bleeding. Bowel function is normal. Bladder function is normal. Patient is not sexually active. Contraception method is Nexplanon. Postpartum depression screening: negative.  Having breakthrough bleeding with nexplanon  The following portions of the patient's history were reviewed and updated as appropriate: allergies, current medications, past family history, past medical history, past social history, past surgical history and problem list.  Review of Systems Pertinent items are noted in HPI.   Objective:    BP 115/74   Pulse 70   Wt 170 lb 4.8 oz (77.2 kg)   BMI 28.34 kg/m   General:  alert and cooperative  Lungs: clear to auscultation bilaterally  Heart:  regular rate and rhythm, S1, S2 normal, no murmur, click, rub or gallop  Abdomen: soft, non-tender; bowel sounds normal; no masses,  no organomegaly   Vulva:  normal. Perineum healing well.        Assessment:     Normal postpartum exam. Pap smear not done at today's visit.   Plan:    1. Contraception: Nexplanon 2. Provera PRN.  3. Follow up in: 1 year or as needed.

## 2019-03-27 ENCOUNTER — Emergency Department (HOSPITAL_COMMUNITY)
Admission: EM | Admit: 2019-03-27 | Discharge: 2019-03-28 | Disposition: A | Discharge status: Home / Self Care | Payer: Medicaid Other | Attending: Emergency Medicine | Admitting: Emergency Medicine

## 2019-03-27 ENCOUNTER — Other Ambulatory Visit: Payer: Self-pay

## 2019-03-27 ENCOUNTER — Encounter (HOSPITAL_COMMUNITY): Payer: Self-pay | Admitting: Emergency Medicine

## 2019-03-27 DIAGNOSIS — A64 Unspecified sexually transmitted disease: Secondary | ICD-10-CM

## 2019-03-27 DIAGNOSIS — R202 Paresthesia of skin: Secondary | ICD-10-CM | POA: Diagnosis not present

## 2019-03-27 DIAGNOSIS — Z202 Contact with and (suspected) exposure to infections with a predominantly sexual mode of transmission: Secondary | ICD-10-CM | POA: Insufficient documentation

## 2019-03-27 DIAGNOSIS — N898 Other specified noninflammatory disorders of vagina: Secondary | ICD-10-CM | POA: Insufficient documentation

## 2019-03-27 DIAGNOSIS — Z79899 Other long term (current) drug therapy: Secondary | ICD-10-CM | POA: Diagnosis not present

## 2019-03-27 DIAGNOSIS — A609 Anogenital herpesviral infection, unspecified: Secondary | ICD-10-CM | POA: Diagnosis not present

## 2019-03-27 NOTE — ED Triage Notes (Signed)
Pt here for eval of vaginal swelling, with "white circle" on her vagina that is sore to touch. She noticed this on halloween. Pt also reports tingling to her entire body, "ears to toes." Pt denies insect bite or abscess/ingrown hairs.

## 2019-03-28 LAB — I-STAT CHEM 8, ED
BUN: 13 mg/dL (ref 6–20)
Calcium, Ion: 1.18 mmol/L (ref 1.15–1.40)
Chloride: 107 mmol/L (ref 98–111)
Creatinine, Ser: 0.7 mg/dL (ref 0.44–1.00)
Glucose, Bld: 84 mg/dL (ref 70–99)
HCT: 33 % — ABNORMAL LOW (ref 36.0–46.0)
Hemoglobin: 11.2 g/dL — ABNORMAL LOW (ref 12.0–15.0)
Potassium: 3.8 mmol/L (ref 3.5–5.1)
Sodium: 141 mmol/L (ref 135–145)
TCO2: 22 mmol/L (ref 22–32)

## 2019-03-28 LAB — WET PREP, GENITAL
Sperm: NONE SEEN
Trich, Wet Prep: NONE SEEN
Yeast Wet Prep HPF POC: NONE SEEN

## 2019-03-28 LAB — RPR: RPR Ser Ql: NONREACTIVE

## 2019-03-28 LAB — HIV ANTIBODY (ROUTINE TESTING W REFLEX): HIV Screen 4th Generation wRfx: NONREACTIVE

## 2019-03-28 MED ORDER — VALACYCLOVIR HCL 1 G PO TABS: 1000.0000 mg | ORAL_TABLET | Freq: Three times a day (TID) | ORAL | 0 refills | Status: AC

## 2019-03-28 MED ORDER — AZITHROMYCIN 250 MG PO TABS
1000.0000 mg | ORAL_TABLET | Freq: Once | ORAL | Status: AC
  Administered 2019-03-28: 1000 mg via ORAL
  Filled 2019-03-28: qty 4

## 2019-03-28 MED ORDER — CEFTRIAXONE SODIUM 250 MG IJ SOLR
250.0000 mg | Freq: Once | INTRAMUSCULAR | Status: AC
  Administered 2019-03-28: 250 mg via INTRAMUSCULAR
  Filled 2019-03-28: qty 250

## 2019-03-28 MED ORDER — STERILE WATER FOR INJECTION IJ SOLN
INTRAMUSCULAR | Status: AC
  Filled 2019-03-28: qty 10

## 2019-03-28 NOTE — Discharge Instructions (Addendum)
1. Medications: Valacyclovir, usual home medications 2. Treatment: rest, drink plenty of fluids, use a condom with every sexual encounter 3. Follow Up: Please followup with your primary doctor in 3 days for discussion of your diagnoses and further evaluation after today's visit; if you do not have a primary care doctor use the resource guide provided to find one; Please return to the ER for worsening symptoms, high fevers or persistent vomiting.  You have been tested for HIV, syphilis, chlamydia and gonorrhea.  These results will be available in approximately 3 days.  Please inform all sexual partners if you test positive for any of these diseases.

## 2019-03-28 NOTE — ED Provider Notes (Signed)
[facility] EMERGENCY DEPARTMENT Provider Note   CSN: [id number] Arrival date & time: 03/27/19  2237     History   Chief Complaint No chief complaint on file.   HPI Carol Guerrero is a 18 y.o. female with a hx of STD presents to the Emergency Department complaining of gradual, persistent, progressively worsening vaginal swelling and lesion onset Oct 31st. Associated symptoms include vaginal discharge.  Pt reports she is sexually active with a new female partner.  Last intercourse was 2 weeks ago.  Pt reports some pain at the site. She denies abd pain, fever, chills, dysuria or hematuria.  No aggravating or alleviating factors.    Pt reports at the same time she developed a headache with intermittent "tingling" in her body since that time.  She reports the feeling is like pins and needles.  It comes and goes and is not persistent. She reports is moves all over her body.  Her headache was gradual onset and lasted 2 days.  It resolved spontaneously and has not returned.  Pt denies vision changes and reports a normal ophthalmologic exam last week with a new prescription for glasses.  No weakness, gait disturbance, speech alterations or other symptoms.  Pt smokes marijuana daily, but denies other drug usage.      The history is provided by the patient and medical records. No language interpreter was used.    Past Medical History:  Diagnosis Date  . Medical history non-contributory     Patient Active Problem List   Diagnosis Date Noted  . Anemia in pregnancy 01/25/2018  . Chlamydia infection during pregnancy, antepartum 10/01/2017    Past Surgical History:  Procedure Laterality Date  . NO PAST SURGERIES       OB History    Gravida  1   Para  1   Term  1   Preterm  0   AB  0   Living  1     SAB  0   TAB  0   Ectopic  0   Multiple  0   Live Births  1            Home Medications    Prior to Admission medications   Medication Sig Start  Date End Date Taking? Authorizing Provider  medroxyPROGESTERone (PROVERA) 10 MG tablet Take 1 tablet (10 mg total) by mouth daily. 06/01/18   Levie Heritage, DO  valACYclovir (VALTREX) 1000 MG tablet Take 1 tablet (1,000 mg total) by mouth 3 (three) times daily for 14 days. 03/28/19 04/11/19  Madiline Saffran, Dahlia Client, PA-C    Family History No family history on file.  Social History Social History   Tobacco Use  . Smoking status: Never Smoker  . Smokeless tobacco: Never Used  Substance Use Topics  . Alcohol use: Not Currently  . Drug use: Not Currently     Allergies   Patient has no known allergies.   Review of Systems Review of Systems  Constitutional: Negative for appetite change, diaphoresis, fatigue, fever and unexpected weight change.  HENT: Negative for mouth sores.   Eyes: Negative for visual disturbance.  Respiratory: Negative for cough, chest tightness, shortness of breath and wheezing.   Cardiovascular: Negative for chest pain.  Gastrointestinal: Negative for abdominal pain, constipation, diarrhea, nausea and vomiting.  Endocrine: Negative for polydipsia, polyphagia and polyuria.  Genitourinary: Positive for vaginal discharge and vaginal pain. Negative for dysuria, frequency, hematuria and urgency.  Musculoskeletal: Negative for back pain and neck stiffness.  Skin: Negative for rash.  Allergic/Immunologic: Negative for immunocompromised state.  Neurological: Negative for syncope, light-headedness and headaches.       Paresthesias  Hematological: Does not bruise/bleed easily.  Psychiatric/Behavioral: Negative for sleep disturbance. The patient is not nervous/anxious.      Physical Exam Updated Vital Signs BP 105/61   Pulse 67   Temp 99.8 F (37.7 C) (Oral)   Resp 18   Ht 5\' 5"  (1.651 m)   Wt 74.8 kg   LMP 12/25/2018   SpO2 99%   BMI 27.46 kg/m   Physical Exam Vitals signs and nursing note reviewed. Exam conducted with a chaperone present.   Constitutional:      General: She is not in acute distress.    Appearance: She is well-developed. She is not diaphoretic.  HENT:     Head: Normocephalic and atraumatic.  Eyes:     Conjunctiva/sclera: Conjunctivae normal.  Neck:     Musculoskeletal: Normal range of motion.  Cardiovascular:     Rate and Rhythm: Normal rate and regular rhythm.  Pulmonary:     Effort: Pulmonary effort is normal. No respiratory distress.  Abdominal:     Palpations: Abdomen is soft.     Tenderness: There is no abdominal tenderness. There is no guarding or rebound.     Hernia: There is no hernia in the left inguinal area.  Genitourinary:    Labia:        Right: Lesion present. No rash or tenderness.        Left: Lesion present. No rash or tenderness.      Vagina: No signs of injury and foreign body. Vaginal discharge (thick, yellow) present. No erythema, tenderness or bleeding.     Cervix: Friability present. No cervical motion tenderness or discharge.     Uterus: Not deviated, not enlarged, not fixed and not tender.      Adnexa:        Right: No mass, tenderness or fullness.         Left: No mass, tenderness or fullness.       Comments: Vesicular and ulcerated lesions of the labia majora and labia minora.  No evidence of secondary infection. Musculoskeletal: Normal range of motion.  Skin:    General: Skin is warm and dry.     Findings: No erythema.  Neurological:     Mental Status: She is alert.     Motor: Motor function is intact.     Coordination: Coordination is intact.     Comments: Cranial nerves grossly intact.  No sensation deficits to the face, arms, trunk or legs.  Strength 5/5 in the bilateral upper and lower extremities.  Gait normal.  No ataxia.  Coordination grossly intact.      ED Treatments / Results  Labs (all labs ordered are listed, but only abnormal results are displayed) Labs Reviewed  WET PREP, GENITAL - Abnormal; Notable for the following components:      Result Value    Clue Cells Wet Prep HPF POC PRESENT (*)    WBC, Wet Prep HPF POC MANY (*)    All other components within normal limits  I-STAT CHEM 8, ED - Abnormal; Notable for the following components:   Hemoglobin 11.2 (*)    HCT 33.0 (*)    All other components within normal limits  HIV ANTIBODY (ROUTINE TESTING W REFLEX)  RPR  GC/CHLAMYDIA PROBE AMP () NOT AT [facility]     Procedures Procedures (including critical care time)  Medications Ordered in ED Medications  cefTRIAXone (ROCEPHIN) injection 250 mg (has no administration in time range)  azithromycin (ZITHROMAX) tablet 1,000 mg (has no administration in time range)     Initial Impression / Assessment and Plan / ED Course  I have reviewed the triage vital signs and the nursing notes.  Pertinent labs & imaging results that were available during my care of the patient were reviewed by me and considered in my medical decision making (see chart for details).  Clinical Course as of Mar 28 315  Wynelle LinkSun Mar 28, 2019  [id number] The patient was discussed with Dr. Eudelia Bunchardama who agrees with the treatment plan.    [HM]    Clinical Course User Index [HM] Hind Chesler, Boyd KerbsHannah, PA-C       Patient presents with several complaints.  Vaginal lesions are consistent with herpes.  Vaginal discharge thick and yellow concerning for possible gonorrhea and/or chlamydia.  Cervix is friable.  Wet prep with many white blood cells.  No adnexal tenderness or cervical motion tenderness to suggest PID.  Patient given azithromycin and Rocephin here in the emergency department to treat for possible STDs.  HIV and RPR along with GC and Chlamydia cultures pending.  Patient will be discharged home with valacyclovir for herpes outbreak.  Patient also with complaints of tingling all over.  Gross neurologic exam is reassuring without focal findings.  Migratory paresthesias not specifically suggestive of CVA or MS.  Paresthesias may be secondary to marijuana usage.  Patient  will need close primary care follow-up for this.  Instructed to return immediately if symptoms worsen.  Final Clinical Impressions(s) / ED Diagnoses   Final diagnoses:  Vaginal discharge  HSV (herpes simplex virus) anogenital infection  Sexually transmitted disease (STD)  Paresthesias    ED Discharge Orders         Ordered    valACYclovir (VALTREX) 1000 MG tablet  3 times daily     03/28/19 0316           Camree Wigington, Dahlia ClientHannah, PA-C 03/28/19 0317    Nira Connardama, Pedro Eduardo, MD 03/28/19 [phone]

## 2019-03-28 NOTE — ED Notes (Signed)
Pt verbalized understanding of d/c instructions, medication and precautions. Pt given extra information on STD's.

## 2019-03-28 NOTE — ED Notes (Signed)
Pelvic cart at bedside. 

## 2019-03-30 LAB — GC/CHLAMYDIA PROBE AMP (~~LOC~~) NOT AT ARMC
Chlamydia: POSITIVE — AB
Neisseria Gonorrhea: POSITIVE — AB

## 2020-08-04 IMAGING — US US MFM OB FOLLOW-UP
1 series · 14 of 28 positions shown · non-contrast
Comparison: none

[Series 1: us mfm ob follow-up · 60 acquisitions, 14 frames shown]
[im 3/60]
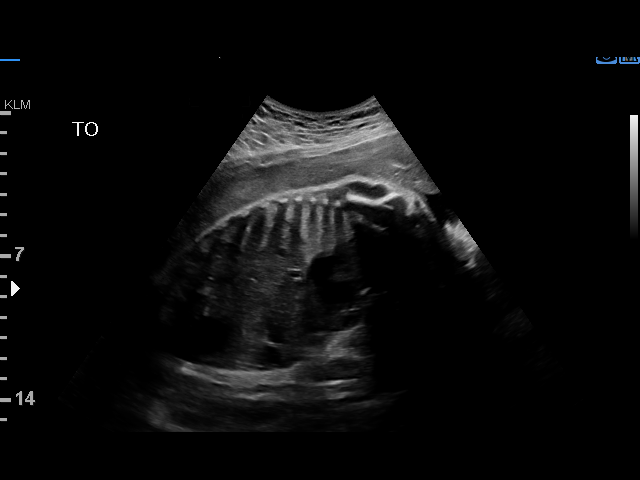
[im 7/60]
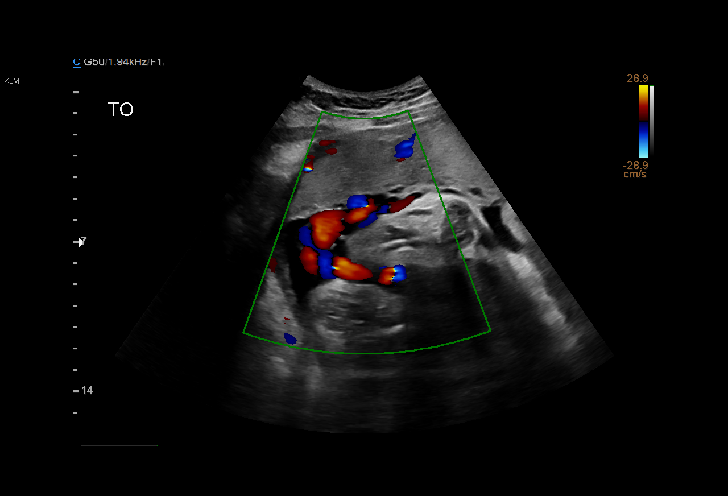
[im 11/60]
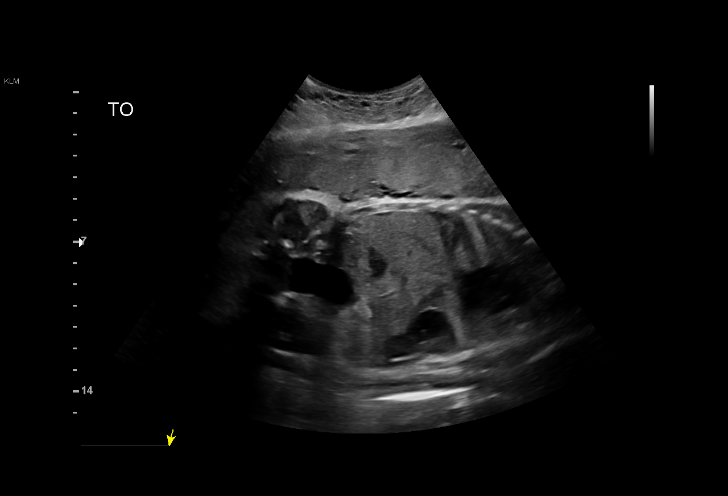
[im 16/60]
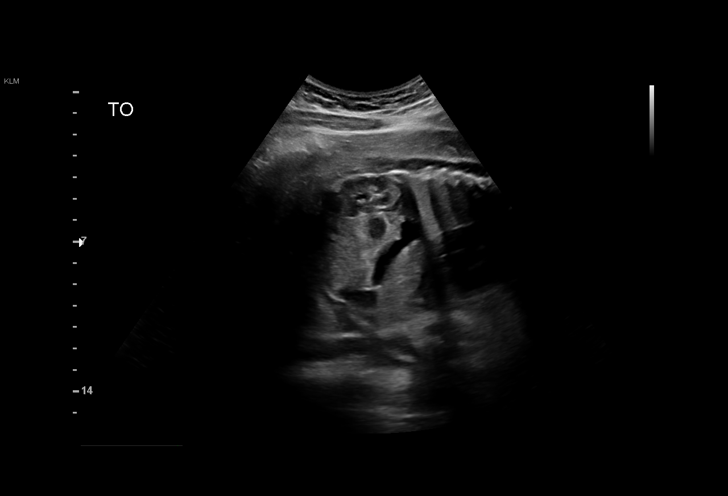
[im 20/60]
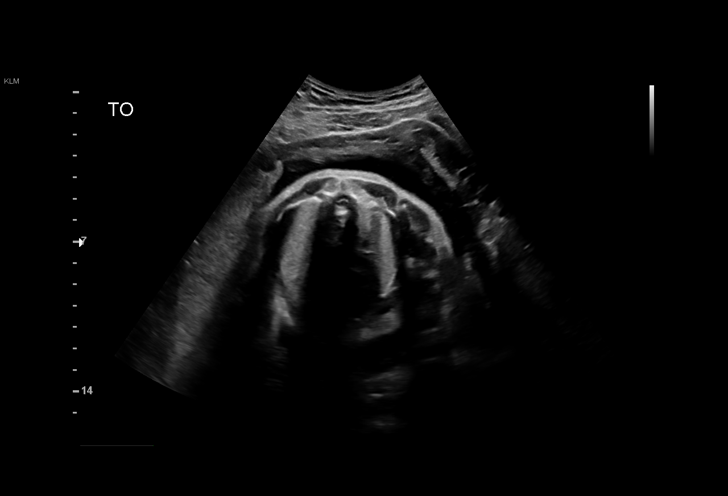
[im 25/60]
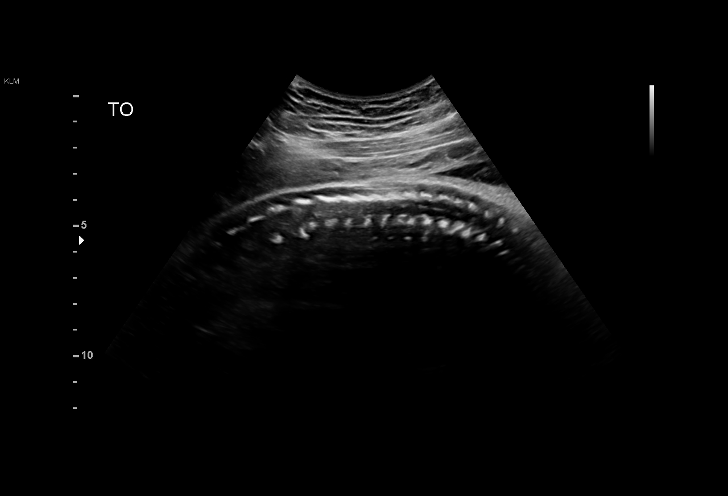
[im 29/60]
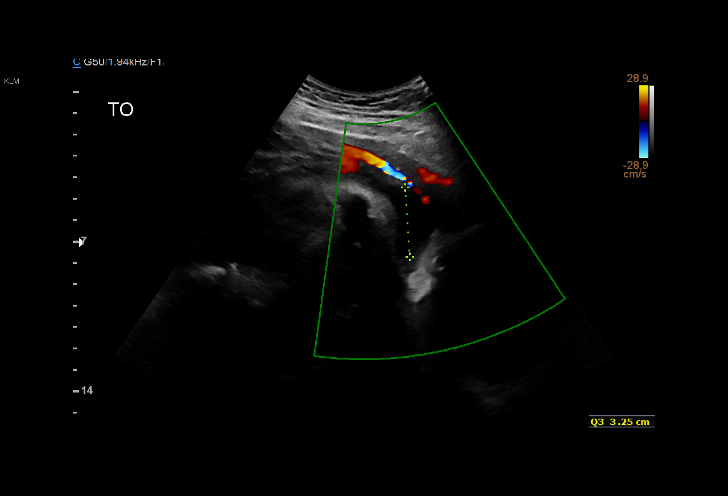
[im 33/60]
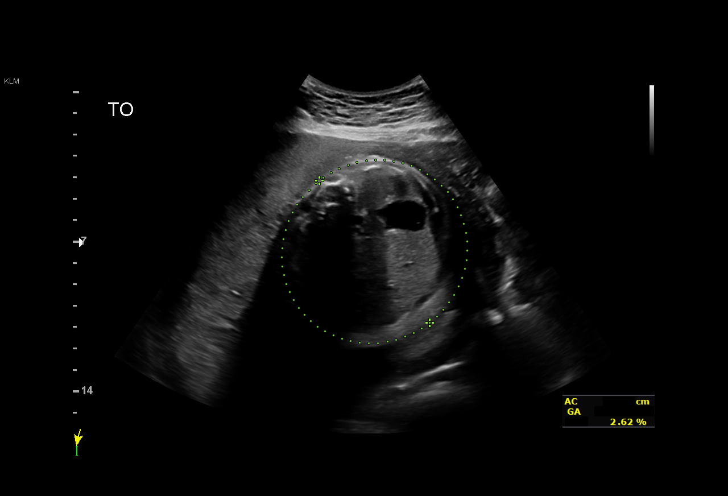
[im 38/60]
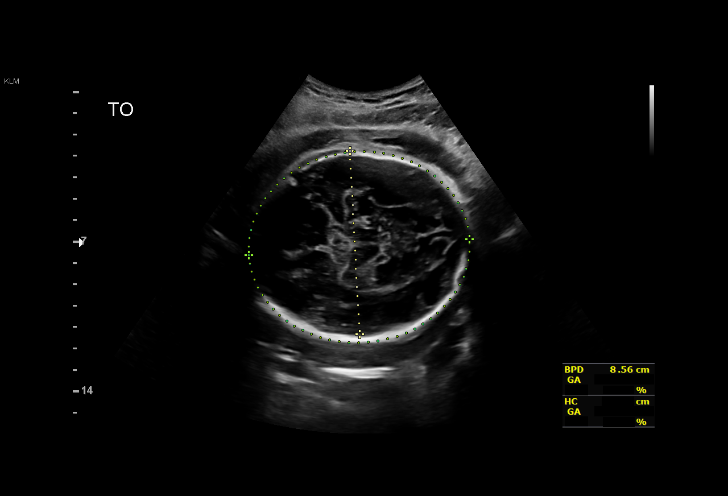
[im 42/60]
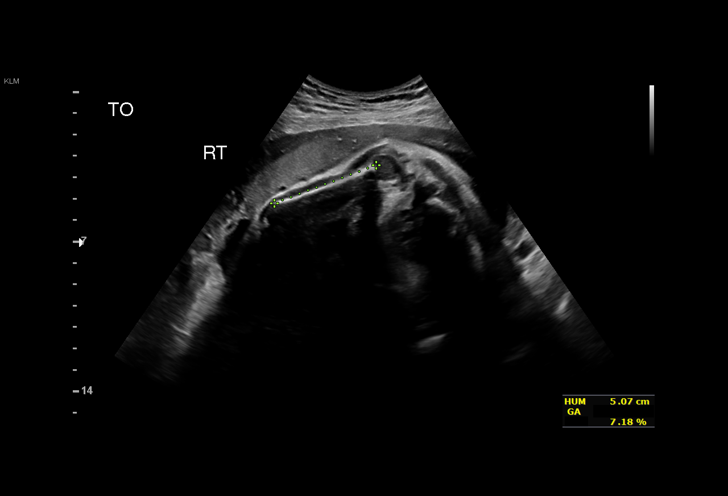
[im 46/60]
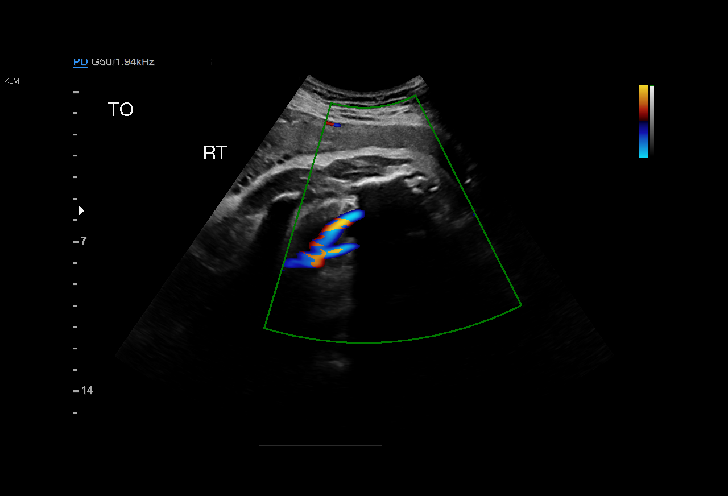
[im 51/60]
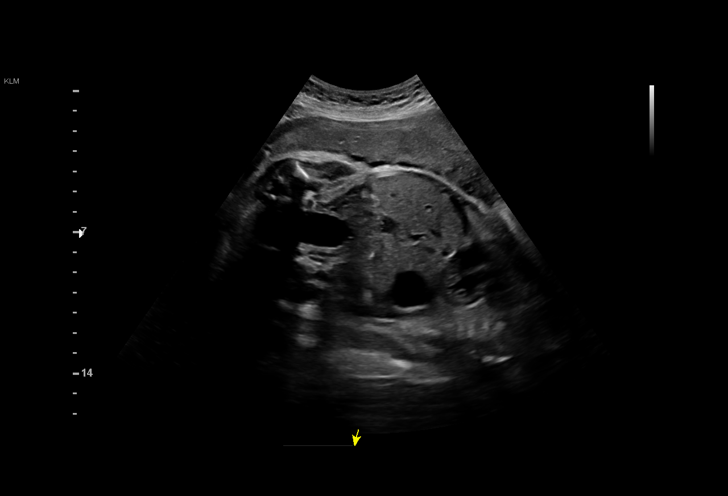
[im 55/60]
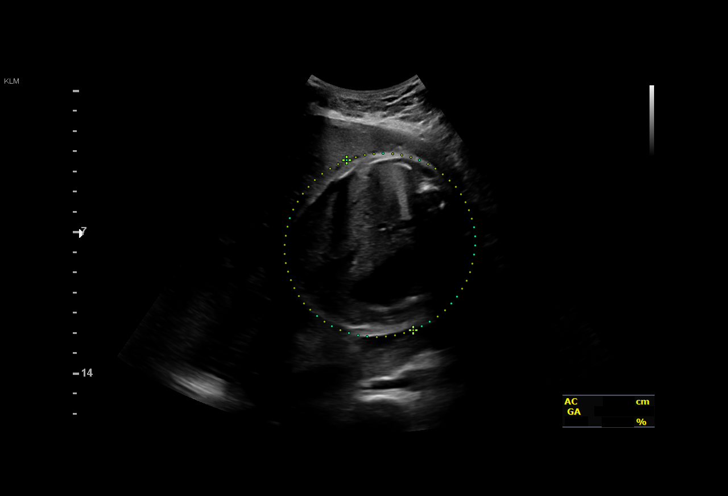
[im 60/60]
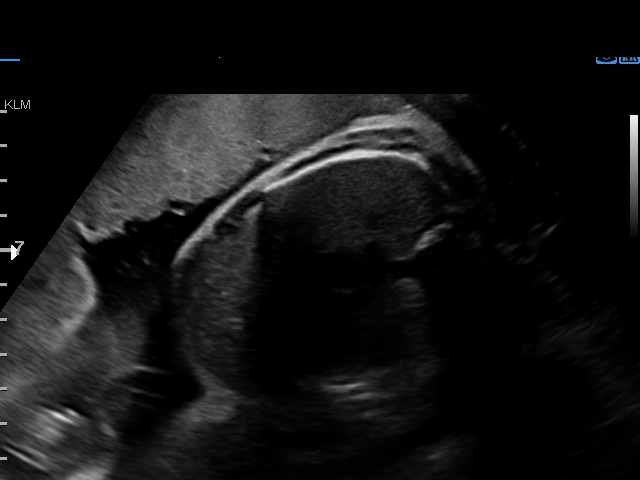

[14 of 28 positions shown; findings below may reference images not displayed]

Indications

Uterine size-date discrepancy, third trimester
33 weeks gestation of pregnancy
Teen pregnancy
Vital Signs

Height:        5'5"
Fetal Evaluation

Num Of Fetuses:         1
Fetal Heart Rate(bpm):  149
Cardiac Activity:       Observed
Presentation:           Cephalic
Placenta:               Anterior
P. Cord Insertion:      Visualized

Amniotic Fluid
AFI FV:      Within normal limits

AFI Sum(cm)     %Tile       Largest Pocket(cm)
12.41           37

RUQ(cm)       RLQ(cm)       LUQ(cm)        LLQ(cm)
2.34
Biometry

BPD:      88.7  mm     G. Age:  35w 6d         94  %    CI:        83.92   %    70 - 86
FL/HC:      18.8   %    19.4 -
HC:      305.2  mm     G. Age:  34w 0d         21  %    HC/AC:      1.05        0.96 -
AC:      290.7  mm     G. Age:  33w 0d         34  %    FL/BPD:     64.7   %    71 - 87
FL:       57.4  mm     G. Age:  30w 0d        < 3  %    FL/AC:      19.7   %    20 - 24

Est. FW:    9440  gm      4 lb 6 oz     36  %
OB History

Gravidity:    1
Gestational Age

LMP:           33w 5d        Date:  06/29/17                 EDD:   04/05/18
U/S Today:     33w 2d                                        EDD:   04/08/18
Best:          33w 5d     Det. By:  LMP  (06/29/17)          EDD:   04/05/18
Anatomy

Cranium:               Appears normal         Aortic Arch:            Previously seen
Cavum:                 Previously seen        Ductal Arch:            Previously seen
Ventricles:            Previously seen        Diaphragm:              Previously seen
Choroid Plexus:        Previously seen        Stomach:                Appears normal, left
sided
Cerebellum:            Previously seen        Abdomen:                Appears normal
Posterior Fossa:       Previously seen        Abdominal Wall:         Previously seen
Nuchal Fold:           Not applicable (>20    Cord Vessels:           Previously seen
wks GA)
Face:                  Orbits and profile     Kidneys:                Appear normal
previously seen
Lips:                  Appears normal         Bladder:                Appears normal
Thoracic:              Appears normal         Spine:                  Appears normal
Heart:                 Previously seen        Upper Extremities:      Previously seen
RVOT:                  Appears normal         Lower Extremities:      Previously seen
LVOT:                  Appears normal

Other:  Fetus appears to be a female. Heels and 5th digit prev visualized.
Nasal bone prev visualized. Technically difficult due to fetal position.
Cervix Uterus Adnexa

Cervix
Not visualized (advanced GA >24wks)
Impression

Amniotic fluid is normal and good fetal activity is seen. Fetal
growth is appropriate for gestational age.
Recommendations

Follow-up scans as clinically indicated.

## 2020-12-25 ENCOUNTER — Ambulatory Visit (LOCAL_COMMUNITY_HEALTH_CENTER): Payer: Medicaid Other | Admitting: Physician Assistant

## 2020-12-25 ENCOUNTER — Encounter: Payer: Self-pay | Admitting: Advanced Practice Midwife

## 2020-12-25 ENCOUNTER — Other Ambulatory Visit: Payer: Self-pay

## 2020-12-25 ENCOUNTER — Ambulatory Visit: Payer: Medicaid Other | Admitting: Physician Assistant

## 2020-12-25 VITALS — BP 116/70 | Ht 65.0 in | Wt 158.8 lb

## 2020-12-25 DIAGNOSIS — Z30013 Encounter for initial prescription of injectable contraceptive: Secondary | ICD-10-CM

## 2020-12-25 DIAGNOSIS — Z3009 Encounter for other general counseling and advice on contraception: Secondary | ICD-10-CM | POA: Diagnosis not present

## 2020-12-25 DIAGNOSIS — Z3046 Encounter for surveillance of implantable subdermal contraceptive: Secondary | ICD-10-CM

## 2020-12-25 DIAGNOSIS — Z0000 Encounter for general adult medical examination without abnormal findings: Secondary | ICD-10-CM

## 2020-12-25 DIAGNOSIS — Z3049 Encounter for surveillance of other contraceptives: Secondary | ICD-10-CM | POA: Diagnosis not present

## 2020-12-25 DIAGNOSIS — Z7189 Other specified counseling: Secondary | ICD-10-CM

## 2020-12-25 MED ORDER — MEDROXYPROGESTERONE ACETATE 150 MG/ML IM SUSP
150.0000 mg | INTRAMUSCULAR | Status: AC
  Administered 2020-12-25 – 2021-08-27 (×4): 150 mg via INTRAMUSCULAR

## 2020-12-25 NOTE — Progress Notes (Signed)
See office visit note same date with Nexplanon removal note.

## 2020-12-25 NOTE — Progress Notes (Signed)
Pt here for PE, Nexplanon removal and Depo.  Depo 150 mg given IM by Romelle Starcher RN, without any complications.  Pt given reminder card to return in 11-13 weeks for next depo. Berdie Ogren, RN

## 2020-12-26 ENCOUNTER — Encounter: Payer: Self-pay | Admitting: Physician Assistant

## 2020-12-26 NOTE — Progress Notes (Signed)
[facility] DEPARTMENT [facility] [address] Main Number: [phone]    Family Planning Visit- Initial Visit  Subjective:  Carol Guerrero is a 20 y.o.  G1P1001   being seen today for an initial annual visit and to discuss contraceptive options.  The patient is currently using Nexplanon for pregnancy prevention. Patient reports she does not want a pregnancy in the next year.  Patient has the following medical conditions has Chlamydia infection during pregnancy, antepartum and Anemia in pregnancy on their problem list.  Chief Complaint  Patient presents with   Contraception    Nexplanon removal and PE    Patient reports that she is here for a Nexplanon removal.  Reports that she had a PE and pap in April of this year with a PCP in [place], [place].  Patient states that she had her Nexplanon inserted after her delivery in 2020.  States that for the first year, she did not have any irregular bleeding but for the last year or so she has had a lot of irregular bleeding even having some months where she does not have bleeding for 1-2 days per month.  Reports that migraines are not as often as they have been in past but that she has them usually with her periods and has photo and phonophobia, aura, dizziness and blurry vision.  States that sleep and IB help to relieve her headaches.  Reports that she has been evaluated for dizziness and fainting with SOB in the past and "they can't find anything wrong."  Also reports that she had an ultrasound prior to leaving [place] and was told that she needed to follow up for "a problem with my left ovary," but does not know what the problem was and moved prior to being able to follow up on this.  Will send ROI for patient's records.  Patient denies any other concerns today.    Body mass index is 26.43 kg/m. - Patient is eligible for diabetes screening based on BMI and age >35?  not applicable HA1C ordered? not applicable  Patient  reports 2  partner/s in last year. Desires STI screening?  No - patient declines.  Has patient been screened once for HCV in the past?  No  No results found for: HCVAB  Does the patient have current drug use (including MJ), have a partner with drug use, and/or has been incarcerated since last result? No  If yes-- Screen for HCV through [facility]   Does the patient meet criteria for HBV testing? No  Criteria:  -Household, sexual or needle sharing contact with HBV -History of drug use -HIV positive -Those with known Hep C   Health Maintenance Due  Topic Date Due   HPV VACCINES (1 - 2-dose series) Never done   Hepatitis C Screening  Never done   CHLAMYDIA SCREENING  03/27/2020   PAP-Cervical Cytology Screening  Never done   PAP SMEAR-Modifier  Never done   INFLUENZA VACCINE  12/18/2020    Review of Systems  All other systems reviewed and are negative.  The following portions of the patient's history were reviewed and updated as appropriate: allergies, current medications, past family history, past medical history, past social history, past surgical history and problem list. Problem list updated.   See flowsheet for other program required questions.  Objective:   Vitals:   12/25/20 1035  BP: 116/70  Weight: 158 lb 12.8 oz (72 kg)  Height: 5\' 5"  (1.651 m)  Physical Exam Constitutional:      General: She is not in acute distress.    Appearance: Normal appearance.  HENT:     Head: Normocephalic and atraumatic.     Mouth/Throat:     Mouth: Mucous membranes are moist.     Pharynx: Oropharynx is clear. No oropharyngeal exudate or posterior oropharyngeal erythema.  Eyes:     Conjunctiva/sclera: Conjunctivae normal.  Neck:     Thyroid: No thyroid mass, thyromegaly or thyroid tenderness.  Cardiovascular:     Rate and Rhythm: Normal rate and regular rhythm.  Pulmonary:     Effort: Pulmonary effort is normal.     Breath sounds: Normal breath sounds.   Musculoskeletal:     Cervical back: Neck supple. No tenderness.  Lymphadenopathy:     Cervical: No cervical adenopathy.  Skin:    General: Skin is warm and dry.     Findings: No bruising, erythema, lesion or rash.     Comments: Tattoos.  Neurological:     Mental Status: She is alert and oriented to person, place, and time.  Psychiatric:        Mood and Affect: Mood normal.        Behavior: Behavior normal.        Thought Content: Thought content normal.        Judgment: Judgment normal.      Assessment and Plan:  Carol Guerrero is a 20 y.o. female presenting to the [facility] Department for an initial annual wellness/contraceptive visit  Contraception counseling: Reviewed all forms of birth control options in the tiered based approach. available including abstinence; over the counter/barrier methods; hormonal contraceptive medication including pill, patch, ring, injection,contraceptive implant, ECP; hormonal and nonhormonal IUDs; permanent sterilization options including vasectomy and the various tubal sterilization modalities. Risks, benefits, and typical effectiveness rates were reviewed.  Questions were answered.  Written information was also given to the patient to review.  Patient desires to have Nexplanon removed and start Depo, this was prescribed for patient. She will follow up in  3 months and prn for surveillance.  She was told to call with any further questions, or with any concerns about this method of contraception.  Emphasized use of condoms 100% of the time for STI prevention.  Patient was not a candidate for ECP today.   1. Encounter for counseling regarding contraception Reviewed with patient as above re: BCM options. Reviewed with patient normal SE of Depo and when to call clinic with concerns. Enc condoms with all sex for STD protection.   2. Nexplanon removal Nexplanon Removal Patient identified, informed consent performed, consent signed.    Appropriate time out taken. Nexplanon site identified.  Area prepped in usual sterile fashon. 2 ml of 1% lidocaine with Epinephrine was used to anesthetize the area at the distal end of the implant and along implant site. A small stab incision was made right beside the implant on the distal portion.  The Nexplanon rod was grasped using manually and removed without difficulty.  There was minimal blood loss. There were no complications.  Steri-strips were applied over the small incision.  A pressure bandage was applied to reduce any bruising.  The patient tolerated the procedure well and was given post procedure instructions.    Nexplanon:   Counseled patient to take OTC analgesic starting as soon as lidocaine starts to wear off and take regularly for at least 48 hr to decrease discomfort.  Specifically to take with food or milk to decrease  stomach upset and for IB 600 mg (3 tablets) every 6 hrs; IB 800 mg (4 tablets) every 8 hrs; or Aleve 2 tablets every 12 hrs.    3. Well woman exam (no gynecological exam) Reviewed with patient healthy habits to maintain general health. Enc MVI 1 po daily. ROI sent to [facility] in [place], [place] for patient's records of PE, pap and U/S. Enc to establish with/ follow up with PCP for primary care concerns, age appropriate screenings and illness.   4. Initiation of Depo Provera OK to start Depo 150 mg IM q 11-13 weeks for 1 year. - medroxyPROGESTERone (DEPO-PROVERA) injection 150 mg     Return in about 11 weeks (around 03/12/2021) for Depo and prn.  No future appointments.  Matt Holmes, PA

## 2020-12-27 ENCOUNTER — Ambulatory Visit: Payer: Medicaid Other

## 2021-02-13 ENCOUNTER — Encounter: Payer: Self-pay | Admitting: Neurology

## 2021-02-26 ENCOUNTER — Other Ambulatory Visit: Payer: Self-pay

## 2021-02-26 ENCOUNTER — Ambulatory Visit: Payer: Medicaid Other | Admitting: Neurology

## 2021-02-26 ENCOUNTER — Encounter: Payer: Self-pay | Admitting: Neurology

## 2021-02-26 VITALS — BP 123/73 | HR 64 | Ht 65.0 in | Wt 170.4 lb

## 2021-02-26 DIAGNOSIS — R55 Syncope and collapse: Secondary | ICD-10-CM | POA: Diagnosis not present

## 2021-02-26 NOTE — Patient Instructions (Signed)
Schedule MRI brain without contrast  2. Schedule 1-hour EEG, if normal we will do a 24-hour EEG  3. We will call with results, if normal, recommend follow-up with PCP. Call for any changes.
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
## 2021-03-07 ENCOUNTER — Other Ambulatory Visit: Payer: Self-pay

## 2021-03-07 ENCOUNTER — Ambulatory Visit: Payer: Medicaid Other | Admitting: Neurology

## 2021-03-07 DIAGNOSIS — R55 Syncope and collapse: Secondary | ICD-10-CM | POA: Diagnosis not present

## 2021-03-12 NOTE — Procedures (Signed)
ELECTROENCEPHALOGRAM REPORT  Date of Study: 03/07/2021  Patient's Name: Carol Guerrero MRN: [id number] Date of Birth: [birth year]  Referring Provider: Dr. Patrcia Dolly  Clinical History: This is a 20 year old woman with recurrent episodes of dizziness and loss of consciousness.   Medications: DEPO-PROVERA injection 150 mg  Technical Summary: A multichannel digital 2-hour EEG recording measured by the international 10-20 system with electrodes applied with paste and impedances below 5000 ohms performed in our laboratory with EKG monitoring in an awake and asleep patient.  Hyperventilation was not performed. Photic stimulation was performed.  The digital EEG was referentially recorded, reformatted, and digitally filtered in a variety of bipolar and referential montages for optimal display.    Description: The patient is awake and asleep during the recording.  During maximal wakefulness, there is a symmetric, medium voltage 10 Hz posterior dominant rhythm that attenuates with eye opening.  The record is symmetric.  During drowsiness and sleep, there is an increase in theta slowing of the background.  Vertex waves and symmetric sleep spindles were seen.Photic stimulation did not elicit any abnormalities.  There were no epileptiform discharges or electrographic seizures seen.    EKG lead showed occasional irregular rhythm.   Impression: This 1-hour  awake and asleep EEG is normal.    Clinical Correlation: A normal EEG does not exclude a clinical diagnosis of epilepsy.  If further clinical questions remain, prolonged EEG may be helpful.  Clinical correlation is advised.   Patrcia Dolly, M.D.

## 2021-03-13 ENCOUNTER — Inpatient Hospital Stay: Admission: RE | Admit: 2021-03-13 | Payer: Medicaid Other | Source: Ambulatory Visit

## 2021-03-13 ENCOUNTER — Telehealth: Payer: Self-pay | Admitting: Neurology

## 2021-03-13 DIAGNOSIS — I499 Cardiac arrhythmia, unspecified: Secondary | ICD-10-CM

## 2021-03-13 NOTE — Telephone Encounter (Signed)
Pt states she has not heard from anyone regarding her EEG results. If someone could call her with results

## 2021-03-13 NOTE — Addendum Note (Signed)
Addended by: Dimas Chyle on: 03/13/2021 03:11 PM   Modules accepted: Orders

## 2021-03-13 NOTE — Telephone Encounter (Signed)
Pt called and informed that EEG was normal. Her heart rhythm was irregular, this can also cause people to blackout. Does she want to see her heart doctor first? She stated she would like the referral to see the heart Dr. Because she has had some chest pains as well as the black out spells, Referral was placed in Epic for the pt.  Or does she want to do the 24-hour EEG as scheduled in Nov? She wants to keep the eeg appointment incase the EEG is before the Cardiology appointment

## 2021-03-13 NOTE — Telephone Encounter (Signed)
Pls let her know that the EEG was normal. Her heart rhythm was irregular, this can also cause people to blackout. Does she want to see her heart doctor first? Or does she want to do the 24-hour EEG as scheduled in Nov? Thanks

## 2021-03-29 ENCOUNTER — Ambulatory Visit (LOCAL_COMMUNITY_HEALTH_CENTER): Payer: Medicaid Other | Admitting: Family Medicine

## 2021-03-29 ENCOUNTER — Other Ambulatory Visit: Payer: Self-pay

## 2021-03-29 VITALS — BP 96/60 | Wt 168.0 lb

## 2021-03-29 DIAGNOSIS — Z3009 Encounter for other general counseling and advice on contraception: Secondary | ICD-10-CM

## 2021-03-29 DIAGNOSIS — Z30013 Encounter for initial prescription of injectable contraceptive: Secondary | ICD-10-CM | POA: Diagnosis not present

## 2021-03-29 DIAGNOSIS — Z3042 Encounter for surveillance of injectable contraceptive: Secondary | ICD-10-CM

## 2021-03-29 NOTE — Progress Notes (Signed)
Patient in clinic for depo today.  Patient reports last depo was given on 12/25/2020  in the right  arm.  Patient given depo today per C. Culp, [place]  12/25/20(provider & date) order .  Depo given in left  arm.  Patient give reminder card for next depo date .  Patient declined condoms today.  Patient to call if any questions or concerns.      Wendi Snipes, FNP

## 2021-04-01 NOTE — Progress Notes (Signed)
Chart reviewed by Pharmacist  Suzanne Walker PharmD, Contract Pharmacist at [facility]  

## 2021-04-02 ENCOUNTER — Other Ambulatory Visit: Payer: Self-pay

## 2021-04-02 ENCOUNTER — Encounter: Payer: Self-pay | Admitting: Cardiology

## 2021-04-02 ENCOUNTER — Ambulatory Visit: Payer: Medicaid Other | Admitting: Cardiology

## 2021-04-02 VITALS — BP 110/60 | HR 75 | Ht 65.0 in | Wt 169.0 lb

## 2021-04-02 DIAGNOSIS — R55 Syncope and collapse: Secondary | ICD-10-CM | POA: Diagnosis not present

## 2021-04-02 DIAGNOSIS — F17200 Nicotine dependence, unspecified, uncomplicated: Secondary | ICD-10-CM

## 2021-04-02 DIAGNOSIS — R002 Palpitations: Secondary | ICD-10-CM

## 2021-04-02 NOTE — Progress Notes (Signed)
Cardiology Office Note:    Date:  04/02/2021   ID:  Carol Guerrero, DOB 06/18/2000, MRN [id number]  PCP:  Carol Guerrero   [facility] Providers Cardiologist:  Debbe Odea, MD     Referring MD: Van Clines, MD   Chief Complaint  Patient presents with   New Patient (Initial Visit)    Referred by PCP for Palpitations. Patient c.o chest pain when she is hungry. Meds reviewed verbally with patient.     History of Present Illness:    Carol Guerrero is a 20 y.o. female who is a current smoker presenting with palpitations.  Patient has a history of syncope/passing out ongoing since 2018.  Symptoms of passing out seem to have worsened of late.  Typically has warning signs of dizziness, blacking out before passing out.  Saw neurology, undergoing EEG.  Heart rhythm noted to be irregular during her EEG exam.  She was advised to follow-up with cardiology.  She states having episodes of palpitations/prominent heartbeats usually when she is hungry.  Symptoms improved after she eats.  Currently smokes.  Past Medical History:  Diagnosis Date   Medical history non-contributory    Migraines     Past Surgical History:  Procedure Laterality Date   NO PAST SURGERIES      Current Medications: No outpatient medications have been marked as taking for the 04/02/21 encounter (Office Visit) with Debbe Odea, MD.   Current Facility-Administered Medications for the 04/02/21 encounter (Office Visit) with Debbe Odea, MD  Medication   medroxyPROGESTERone (DEPO-PROVERA) injection 150 mg     Allergies:   Patient has no known allergies.   Social History   Socioeconomic History   Marital status: Single    Spouse name: Not on file   Number of children: Not on file   Years of education: Not on file   Highest education level: Not on file  Occupational History   Not on file  Tobacco Use   Smoking status: Never   Smokeless tobacco: Never  Vaping Use   Vaping Use: Never used   Substance and Sexual Activity   Alcohol use: Yes    Comment: Socially   Drug use: Not Currently    Types: Marijuana   Sexual activity: Not Currently    Partners: Female, Female    Birth control/protection: Implant  Other Topics Concern   Not on file  Social History Narrative   Right handed    Social Determinants of Health   Financial Resource Strain: Not on file  Food Insecurity: Not on file  Transportation Needs: Not on file  Physical Activity: Not on file  Stress: Not on file  Social Connections: Not on file     Family History: The patient's family history includes Diabetes in her maternal grandfather, maternal grandmother, and paternal grandmother; Hypertension in her maternal aunt, mother, and sister.  ROS:   Please see the history of present illness.     All other systems reviewed and are negative.  EKGs/Labs/Other Studies Reviewed:    The following studies were reviewed today:   EKG:  EKG is  ordered today.  The ekg ordered today demonstrates normal sinus rhythm, sinus arrhythmia  Recent Labs: No results found for requested labs within last 8760 hours.  Recent Lipid Panel No results found for: CHOL, TRIG, HDL, CHOLHDL, VLDL, LDLCALC, LDLDIRECT   Risk Assessment/Calculations:          Physical Exam:    VS:  BP 110/60 (BP Location: Left Arm, Patient Position: Sitting,  Cuff Size: Normal)   Pulse 75   Ht 5\' 5"  (1.651 m)   Wt 169 lb (76.7 kg)   SpO2 99%   BMI 28.12 kg/m     Wt Readings from Last 3 Encounters:  04/02/21 169 lb (76.7 kg)  03/29/21 168 lb (76.2 kg)  02/26/21 170 lb 6.4 oz (77.3 kg)     GEN:  Well nourished, well developed in no acute distress HEENT: Normal NECK: No JVD; No carotid bruits LYMPHATICS: No lymphadenopathy CARDIAC: RRR, no murmurs, rubs, gallops RESPIRATORY:  Clear to auscultation without rales, wheezing or rhonchi  ABDOMEN: Soft, non-tender, non-distended MUSCULOSKELETAL:  No edema; No deformity  SKIN: Warm and  dry NEUROLOGIC:  Alert and oriented x 3 PSYCHIATRIC:  Normal affect   ASSESSMENT:    1. Palpitations   2. Smoking   3. Syncope, unspecified syncope type    PLAN:    In order of problems listed above:  Palpitations, history of irregular heartbeats.  Place cardiac monitor to evaluate any significant arrhythmias. Current smoker, smoking cessation advised. Syncope, etiology seems vasovagal with prodromal symptoms of dizziness, blurry vision prior to passing out.  Follow-up after cardiac monitor.     Medication Adjustments/Labs and Tests Ordered: Current medicines are reviewed at length with the patient today.  Concerns regarding medicines are outlined above.  Orders Placed This Encounter  Procedures   LONG TERM MONITOR (3-14 DAYS)   EKG 12-Lead    No orders of the defined types were placed in this encounter.   Patient Instructions  Medication Instructions:  Your physician recommends that you continue on your current medications as directed. Please refer to the Current Medication list given to you today.  *If you need a refill on your cardiac medications before your next appointment, please call your pharmacy*   Lab Work:  None Ordered   Testing/Procedures:  Your physician has recommended that you wear a Zio XT monitor for 2 weeks.   This monitor is a medical device that records the heart's electrical activity. Doctors most often use these monitors to diagnose arrhythmias. Arrhythmias are problems with the speed or rhythm of the heartbeat. The monitor is a small device applied to your chest. You can wear one while you do your normal daily activities. While wearing this monitor if you have any symptoms to push the button and record what you felt. Once you have worn this monitor for the period of time provider prescribed (Usually 14 days), you will return the monitor device in the postage paid box. Once it is returned they will download the data collected and provide 04/28/21 with  a report which the provider will then review and we will call you with those results. Important tips:  Avoid showering during the first 24 hours of wearing the monitor. Avoid excessive sweating to help maximize wear time. Do not submerge the device, no hot tubs, and no swimming pools. Keep any lotions or oils away from the patch. After 24 hours you may shower with the patch on. Take brief showers with your back facing the shower head.  Do not remove patch once it has been placed because that will interrupt data and decrease adhesive wear time. Push the button when you have any symptoms and write down what you were feeling. Once you have completed wearing your monitor, remove and place into box which has postage paid and place in your outgoing mailbox.  If for some reason you have misplaced your box then call our office and we  can provide another box and/or mail it off for you.    If you have labs (blood work) drawn today and your tests are completely normal, you will receive your results only by: MyChart Message (if you have MyChart) OR A paper copy in the mail If you have any lab test that is abnormal or we need to change your treatment, we will call you to review the results.   Follow-Up: At [facility], you and your health needs are our priority.  As part of our continuing mission to provide you with exceptional heart care, we have created designated Provider Care Teams.  These Care Teams include your primary Cardiologist (physician) and Advanced Practice Providers (APPs -  Physician Assistants and Nurse Practitioners) who all work together to provide you with the care you need, when you need it.  We recommend signing up for the patient portal called "MyChart".  Sign up information is provided on this After Visit Summary.  MyChart is used to connect with patients for Virtual Visits (Telemedicine).  Patients are able to view lab/test results, encounter notes, upcoming appointments, etc.   Non-urgent messages can be sent to your provider as well.   To learn more about what you can do with MyChart, go to [email or web address].    Your next appointment:   6-8 week(s)  The format for your next appointment:   In Person  Provider:   You may see Dr. Azucena Cecil or one of the following Advanced Practice Providers on your designated Care Team:   Nicolasa Ducking, NP Eula Listen, PA-C Cadence Fransico Michael, [place]    Other Instructions    Signed, Debbe Odea, MD  04/02/2021 4:39 PM    [facility]

## 2021-04-02 NOTE — Patient Instructions (Signed)
Medication Instructions:  Your physician recommends that you continue on your current medications as directed. Please refer to the Current Medication list given to you today.  *If you need a refill on your cardiac medications before your next appointment, please call your pharmacy*   Lab Work:  None Ordered   Testing/Procedures:  Your physician has recommended that you wear a Zio XT monitor for 2 weeks.   This monitor is a medical device that records the heart's electrical activity. Doctors most often use these monitors to diagnose arrhythmias. Arrhythmias are problems with the speed or rhythm of the heartbeat. The monitor is a small device applied to your chest. You can wear one while you do your normal daily activities. While wearing this monitor if you have any symptoms to push the button and record what you felt. Once you have worn this monitor for the period of time provider prescribed (Usually 14 days), you will return the monitor device in the postage paid box. Once it is returned they will download the data collected and provide [place] with a report which the provider will then review and we will call you with those results. Important tips:  Avoid showering during the first 24 hours of wearing the monitor. Avoid excessive sweating to help maximize wear time. Do not submerge the device, no hot tubs, and no swimming pools. Keep any lotions or oils away from the patch. After 24 hours you may shower with the patch on. Take brief showers with your back facing the shower head.  Do not remove patch once it has been placed because that will interrupt data and decrease adhesive wear time. Push the button when you have any symptoms and write down what you were feeling. Once you have completed wearing your monitor, remove and place into box which has postage paid and place in your outgoing mailbox.  If for some reason you have misplaced your box then call our office and we can provide another box  and/or mail it off for you.    If you have labs (blood work) drawn today and your tests are completely normal, you will receive your results only by: MyChart Message (if you have MyChart) OR A paper copy in the mail If you have any lab test that is abnormal or we need to change your treatment, we will call you to review the results.   Follow-Up: At [facility], you and your health needs are our priority.  As part of our continuing mission to provide you with exceptional heart care, we have created designated Provider Care Teams.  These Care Teams include your primary Cardiologist (physician) and Advanced Practice Providers (APPs -  Physician Assistants and Nurse Practitioners) who all work together to provide you with the care you need, when you need it.  We recommend signing up for the patient portal called "MyChart".  Sign up information is provided on this After Visit Summary.  MyChart is used to connect with patients for Virtual Visits (Telemedicine).  Patients are able to view lab/test results, encounter notes, upcoming appointments, etc.  Non-urgent messages can be sent to your provider as well.   To learn more about what you can do with MyChart, go to [email or web address].    Your next appointment:   6-8 week(s)  The format for your next appointment:   In Person  Provider:   You may see Dr. Azucena Cecil or one of the following Advanced Practice Providers on your designated Care Team:   Nicolasa Ducking, NP  Eula Listen, PA-C Cadence Fransico Michael, PA-C    Other Instructions

## 2021-04-04 ENCOUNTER — Other Ambulatory Visit: Payer: Medicaid Other

## 2021-04-19 ENCOUNTER — Ambulatory Visit: Payer: Medicaid Other

## 2021-04-19 ENCOUNTER — Telehealth: Payer: Self-pay | Admitting: Family Medicine

## 2021-04-19 NOTE — Telephone Encounter (Signed)
Patient says she was told by her doctor to get her Depo shot every month and she wanted to schedule her appointment for next week, however according to my calculations, her next shot isn't due until January (11 weeks from Nov. 10).  She would like to speak to a nurse or provider for clarification so that she can be at peace about when she is supposed to schedule her next Depo shot. Please call.

## 2021-05-25 ENCOUNTER — Ambulatory Visit (INDEPENDENT_AMBULATORY_CARE_PROVIDER_SITE_OTHER): Payer: Medicaid Other

## 2021-05-25 ENCOUNTER — Telehealth: Payer: Self-pay

## 2021-05-25 DIAGNOSIS — R002 Palpitations: Secondary | ICD-10-CM

## 2021-05-25 NOTE — Telephone Encounter (Signed)
While doing chart prep for patients follow up  appointment next week it was discovered that patients Zio monitor had never been registered. It was ordered on 04/02/21. I called patient and she confirmed that she never received a monitor in the mail. She states that she has been experiencing less palpitations, however is still having some. I advised that we go ahead and proceed with the original plan to have her wear the monitor. I scheduled and registered it to be mailed. Also re-scheduled patients follow up for 06/29/21. Patient was agreeable to plan.

## 2021-05-29 ENCOUNTER — Ambulatory Visit: Payer: Medicaid Other | Admitting: Nurse Practitioner

## 2021-05-29 ENCOUNTER — Encounter: Payer: Self-pay | Admitting: Nurse Practitioner

## 2021-05-29 ENCOUNTER — Other Ambulatory Visit: Payer: Self-pay

## 2021-05-29 DIAGNOSIS — Z113 Encounter for screening for infections with a predominantly sexual mode of transmission: Secondary | ICD-10-CM

## 2021-05-29 LAB — WET PREP FOR TRICH, YEAST, CLUE
Trichomonas Exam: NEGATIVE
Yeast Exam: NEGATIVE

## 2021-05-29 NOTE — Progress Notes (Signed)
[facility] Department  STI clinic/screening visit [place] [address] [phone]  Subjective:  Carol Guerrero is a 21 y.o. female being seen today for an STI screening visit. The patient reports they do have symptoms.  Patient reports that they do not desire a pregnancy in the next year.   They reported they are not interested in discussing contraception today.    Patient's last menstrual period was 05/25/2021 (approximate).   Patient has the following medical conditions:   Patient Active Problem List   Diagnosis Date Noted   Anemia in pregnancy 01/25/2018   Chlamydia infection during pregnancy, antepartum 10/01/2017    Chief Complaint  Patient presents with   SEXUALLY TRANSMITTED DISEASE    Screening     HPI  Patient reports to clinic today for STD screening.  Patient reports that for a week she has been experiencing a vaginal odor that is most noticeable after sex.  No other signs and symptoms noted.   Last HIV test per patient/review of record was 03/2019 Patient reports last pap was 06/2018 in [place], [place].   Screening for MPX risk: Does the patient have an unexplained rash? No Is the patient MSM? No Does the patient endorse multiple sex partners or anonymous sex partners? No Did the patient have close or sexual contact with a person diagnosed with MPX? No Has the patient traveled outside the [place] where MPX is endemic? No Is there a high clinical suspicion for MPX-- evidenced by one of the following No  -Unlikely to be chickenpox  -Lymphadenopathy  -Rash that present in same phase of evolution on any given body part See flowsheet for further details and programmatic requirements.    The following portions of the patient's history were reviewed and updated as appropriate: allergies, current medications, past medical history, past social history, past surgical history and problem list.  Objective:  There were no vitals filed  for this visit.  Physical Exam HENT:     Head: Normocephalic.     Mouth/Throat:     Mouth: Mucous membranes are moist.     Comments: No visible dental caries.  Pulmonary:     Effort: Pulmonary effort is normal.  Abdominal:     General: Abdomen is flat.     Palpations: Abdomen is soft.  Genitourinary:    Comments: External genitalia/pubic area without nits, lice, edema, erythema, lesions and inguinal adenopathy. Vagina with normal mucosa and discharge. Cervix without visible lesions. Uterus firm, mobile, nt, no masses, no CMT, no adnexal tenderness or fullness. pH 4.5. Musculoskeletal:     Cervical back: Full passive range of motion without pain, normal range of motion and neck supple.  Skin:    General: Skin is warm and dry.  Neurological:     Mental Status: She is alert and oriented to person, place, and time.  Psychiatric:        Attention and Perception: Attention normal.        Behavior: Behavior is cooperative.     Assessment and Plan:  [name] is a 21 y.o. female presenting to the [facility] Department for STI screening  1. Screening examination for venereal disease -21 year old female in clinic today for STD screening.  -Patient accepted all screenings including oral, vaginal CT/GC and declines bloodwork for HIV/RPR.  Patient meets criteria for HepB screening? Yes. Ordered? No - patient declines  Patient meets criteria for HepC screening? Yes. Ordered? No - patient declines   Treat  wet prep per standing order Discussed time line for [facility] results and that patient will be called with positive results and encouraged patient to call if she had not heard in 2 weeks.  Counseled to return or seek care for continued or worsening symptoms Recommended condom use with all sex  Patient is currently using Hormonal Contraception: Injection, Rings and Patches to prevent pregnancy.   - WET PREP FOR TRICH, YEAST, [facility]     Return if symptoms worsen or fail to improve.    Gregary Cromer, FNP

## 2021-05-29 NOTE — Progress Notes (Signed)
Pt here for STD screening.  Wet mount results reviewed, no treatment required per SO.  Pt declined condoms.  Starletta Houchin M Paula Busenbark, RN ° °

## 2021-05-30 DIAGNOSIS — R002 Palpitations: Secondary | ICD-10-CM | POA: Diagnosis not present

## 2021-06-01 ENCOUNTER — Ambulatory Visit: Payer: Medicaid Other | Admitting: Cardiology

## 2021-06-02 LAB — GONOCOCCUS CULTURE

## 2021-06-13 ENCOUNTER — Telehealth: Payer: Self-pay | Admitting: Family Medicine

## 2021-06-13 NOTE — Telephone Encounter (Signed)
Pt wants to know test results 

## 2021-06-14 ENCOUNTER — Ambulatory Visit (LOCAL_COMMUNITY_HEALTH_CENTER): Payer: Medicaid Other

## 2021-06-14 ENCOUNTER — Other Ambulatory Visit: Payer: Self-pay

## 2021-06-14 VITALS — BP 125/71 | Ht 65.0 in | Wt 183.5 lb

## 2021-06-14 DIAGNOSIS — Z3042 Encounter for surveillance of injectable contraceptive: Secondary | ICD-10-CM

## 2021-06-14 DIAGNOSIS — Z3009 Encounter for other general counseling and advice on contraception: Secondary | ICD-10-CM

## 2021-06-14 DIAGNOSIS — Z30013 Encounter for initial prescription of injectable contraceptive: Secondary | ICD-10-CM

## 2021-06-14 NOTE — Progress Notes (Signed)
11 weeks 0 days post depo. Pt has gained approx 15 lbs since last visit on 03/29/2021. Pt contributes wt gain to depo and new job. RN counseled pt  that depo can affect appetite center of brain and to be aware of food intake and to reduce sugar/carb, and to maintain healthy eating with lean meats, fruit/veg, adequate water intake. Pt plans to exercise as well. Depo given today per order by Beatris Si, PA dated 12/25/2020. Tolerated well R delt. Next depo due 08/30/2021, has reminder. Jerel Shepherd, RN

## 2021-06-29 ENCOUNTER — Ambulatory Visit: Payer: Medicaid Other | Admitting: Cardiology

## 2021-08-27 ENCOUNTER — Ambulatory Visit (LOCAL_COMMUNITY_HEALTH_CENTER): Payer: Medicaid Other

## 2021-08-27 VITALS — BP 105/68 | Ht 65.0 in | Wt 203.5 lb

## 2021-08-27 DIAGNOSIS — Z30013 Encounter for initial prescription of injectable contraceptive: Secondary | ICD-10-CM | POA: Diagnosis not present

## 2021-08-27 DIAGNOSIS — Z3009 Encounter for other general counseling and advice on contraception: Secondary | ICD-10-CM | POA: Diagnosis not present

## 2021-08-27 DIAGNOSIS — Z3042 Encounter for surveillance of injectable contraceptive: Secondary | ICD-10-CM

## 2021-08-27 NOTE — Progress Notes (Addendum)
10 weeks 4 days post depo.  Denies any bleeding or other complications.  Depo administered IM left deltoid per order by C. [place], [place], dated 12/25/20.  Tolerated well. ? ?Weight gain 20.5 lbs since last depo 06/14/21.  Pt expressed concern but says she drinks a lot of sodas, eats a lot, and sits in the evening after work and doesn't exercise. Denies unusual stress or other reason for increased appetite.  Pt wondererd if she could be pregnant but denies sexual activity since mid January before last depo. Discussed healthy eating, stop sodas, increase exercise; pt agreed.   ? ?Consulted with E. Sciora, CNM, re: wt gain and she said can continue depo and advised healthy eating and exercise.  Said pt could do PT at home if desired. ? ?Next depo due 11/12/21; reminder given to call for appt.   Tonny Branch, RN ?Consulted on the plan of care for this client.  I agree with the documented note and actions taken to provide care for this client.  Ola Spurr, CNM ? ? ? ?

## 2021-11-15 ENCOUNTER — Ambulatory Visit: Payer: Medicaid Other

## 2021-11-23 ENCOUNTER — Ambulatory Visit (LOCAL_COMMUNITY_HEALTH_CENTER): Payer: Medicaid Other

## 2021-11-23 VITALS — BP 100/61 | Ht 65.0 in | Wt 213.5 lb

## 2021-11-23 DIAGNOSIS — Z3042 Encounter for surveillance of injectable contraceptive: Secondary | ICD-10-CM

## 2021-11-23 DIAGNOSIS — Z3009 Encounter for other general counseling and advice on contraception: Secondary | ICD-10-CM

## 2021-11-23 MED ORDER — MEDROXYPROGESTERONE ACETATE 150 MG/ML IM SUSP
150.0000 mg | Freq: Once | INTRAMUSCULAR | Status: AC
  Administered 2021-11-23: 150 mg via INTRAMUSCULAR

## 2021-11-23 NOTE — Progress Notes (Addendum)
12 weeks 4 days post Depo.  Reports no bleeding.  Reports increased frequency of headaches/migraines - 2/week.  Frequency has increased since on Depo. Consult with provider, E.Sciora CNM. Provider encouraged increased fluids and patient to follow-up with neurologist and to administer Depo. Depo administered in right deltoid per order for 1 year dated 12/25/2020 by C. [place].  Next Depo due 02/08/22 - patient provided reminder.  Last exam was 12/25/2020 and discussed with patient that exam would be needed prior to next Depo.    Consulted on the plan of care for this client.  I agree with the documented note and actions taken to provide care for this client.  Hazle Coca, CNM

## 2022-02-08 ENCOUNTER — Encounter: Payer: Self-pay | Admitting: Advanced Practice Midwife

## 2022-02-08 ENCOUNTER — Ambulatory Visit (LOCAL_COMMUNITY_HEALTH_CENTER): Payer: Medicaid Other | Admitting: Advanced Practice Midwife

## 2022-02-08 VITALS — BP 124/65 | HR 87 | Temp 98.1°F | Resp 16 | Ht 65.0 in | Wt 215.0 lb

## 2022-02-08 DIAGNOSIS — F17290 Nicotine dependence, other tobacco product, uncomplicated: Secondary | ICD-10-CM

## 2022-02-08 DIAGNOSIS — Z7289 Other problems related to lifestyle: Secondary | ICD-10-CM

## 2022-02-08 DIAGNOSIS — E669 Obesity, unspecified: Secondary | ICD-10-CM

## 2022-02-08 DIAGNOSIS — A599 Trichomoniasis, unspecified: Secondary | ICD-10-CM

## 2022-02-08 DIAGNOSIS — Z3009 Encounter for other general counseling and advice on contraception: Secondary | ICD-10-CM

## 2022-02-08 DIAGNOSIS — Z3042 Encounter for surveillance of injectable contraceptive: Secondary | ICD-10-CM

## 2022-02-08 DIAGNOSIS — Z309 Encounter for contraceptive management, unspecified: Secondary | ICD-10-CM

## 2022-02-08 DIAGNOSIS — F1290 Cannabis use, unspecified, uncomplicated: Secondary | ICD-10-CM

## 2022-02-08 DIAGNOSIS — F5089 Other specified eating disorder: Secondary | ICD-10-CM

## 2022-02-08 HISTORY — DX: Other problems related to lifestyle: Z72.89

## 2022-02-08 HISTORY — DX: Other specified eating disorder: F50.89

## 2022-02-08 LAB — WET PREP FOR TRICH, YEAST, CLUE
Trichomonas Exam: POSITIVE — AB
Yeast Exam: NEGATIVE

## 2022-02-08 LAB — HEMOGLOBIN, FINGERSTICK: Hemoglobin: 11.7 g/dL (ref 11.1–15.9)

## 2022-02-08 MED ORDER — METRONIDAZOLE 500 MG PO TABS: 500.0000 mg | ORAL_TABLET | Freq: Two times a day (BID) | ORAL | 0 refills | Status: AC

## 2022-02-08 MED ORDER — MEDROXYPROGESTERONE ACETATE 150 MG/ML IM SUSP
150.0000 mg | INTRAMUSCULAR | Status: AC
  Administered 2022-02-08 – 2022-10-09 (×4): 150 mg via INTRAMUSCULAR

## 2022-02-08 NOTE — Progress Notes (Signed)
Patient is here for annual exam and depo.   Depo given left deltoid - tolerated well. Per standing order of E. Sciora, CNM placed today 02/08/22. Patient aware to return 04/26/22 for next depo injection.   Wet mount reviewed during clinic visit - treatment for trich per SO. Dispensed to patient Metronidazole 500mg  BID for 7 days. Education given and questions answered. Patient aware to return in 3 months for TOC.   Al Decant, RN

## 2022-02-08 NOTE — Progress Notes (Signed)
[facility] [place] Number: [phone]    Family Planning Visit- Initial Visit  Subjective:  Carol Guerrero is a 21 y.o. SBF smoker  [id number] ([birth year] yo Carol Guerrero) being seen today for an initial annual visit and to discuss reproductive life planning.  The patient is currently using Hormonal Injection for pregnancy prevention. Patient reports she/her/hers  does not want a pregnancy in the next year.    she/her/hers report they are looking for a method that provides High efficacy at preventing pregnancy and decreasing cramping and menses flow  Patient has the following medical conditions has Chlamydia infection during pregnancy, antepartum and Anemia in pregnancy on their problem list.  Chief Complaint  Patient presents with   Annual Exam   Contraception    Depo    Patient reports here for physical, pap, and DMPA. Last DMPA given 11/23/21. Last physical 12/25/21. Last pap maybe in [place] but not sure. No menses with DMPA. Last sex 06/2021 without condom; not with that partner currently. Smoking cigars and MJ daily. Last ETOH 02/02/22 (2 c. Tequila) q 2 wks. Last dental exam 2018. Employed 40 hrs/wk and living with her Carol Guerrero and pt's mom and mom's girlfriend.  PHQ-9=7 and declines counseling.   Patient denies cigs, vaping  There is no height or weight on file to calculate BMI. - Patient is eligible for diabetes screening based on BMI and age >61?  Not applicable EP3I ordered? not applicable  Patient reports 1  partner/s in last year. Desires STI screening?  No - declines bloodwork  Has patient been screened once for HCV in the past?  No  No results found for: "HCVAB"  Does the patient have current drug use (including MJ), have a partner with drug use, and/or has been incarcerated since last result? Yes  If yes-- Screen for HCV through [facility]   Does the patient meet criteria for HBV testing? Yes  Criteria:   -Household, sexual or needle sharing contact with HBV -History of drug use -HIV positive -Those with known Hep C   Health Maintenance Due  Topic Date Due   COVID-19 Vaccine (1) Never done   HPV VACCINES (1 - 2-dose series) Never done   Hepatitis C Screening  Never done   CHLAMYDIA SCREENING  03/27/2020   PAP-Cervical Cytology Screening  Never done   PAP SMEAR-Modifier  Never done   INFLUENZA VACCINE  12/18/2021    Review of Systems  Constitutional:  Positive for weight loss (15 lb wt gain in past year; not exercising; suggestions given).  All other systems reviewed and are negative.   The following portions of the patient's history were reviewed and updated as appropriate: allergies, current medications, past family history, past medical history, past social history, past surgical history and problem list. Problem list updated.   See flowsheet for other program required questions.  Objective:  There were no vitals filed for this visit.  Physical Exam Constitutional:      Appearance: Normal appearance. She is obese.  HENT:     Head: Normocephalic and atraumatic.     Mouth/Throat:     Mouth: Mucous membranes are moist.     Comments: Good dentition; last dental exam 2018 Eyes:     Conjunctiva/sclera: Conjunctivae normal.  Neck:     Thyroid: No thyroid mass, thyromegaly or thyroid tenderness.  Cardiovascular:     Rate and Rhythm: Normal rate and regular rhythm.  Pulmonary:  Effort: Pulmonary effort is normal.     Breath sounds: Normal breath sounds.  Chest:  Breasts:    Right: Normal.     Left: Normal.  Abdominal:     Palpations: Abdomen is soft.     Comments: Soft without masses or tenderness, fair tone, increased adipose  Genitourinary:    General: Normal vulva.     Exam position: Lithotomy position.     Vagina: Vaginal discharge (white creamy leukorrhea, ph>4.5) present.     Cervix: Friability (sl friable to pap) present.     Uterus: Normal.       Adnexa: Right adnexa normal and left adnexa normal.     Rectum: Normal.     Comments: Pap done Musculoskeletal:        General: Normal range of motion.     Cervical back: Normal range of motion and neck supple.  Skin:    General: Skin is warm and dry.  Neurological:     Mental Status: She is alert.  Psychiatric:        Mood and Affect: Mood normal.      Assessment and Plan:  [name] is a 21 y.o. female presenting to the [facility] Department for an initial annual wellness/contraceptive visit  Contraception counseling: Reviewed options based on patient desire and reproductive life plan. Patient is interested in Hormonal Injection. This was provided to the patient today.  if not why not clearly documented  Risks, benefits, and typical effectiveness rates were reviewed.  Questions were answered.  Written information was also given to the patient to review.    The patient will follow up in  11-13 weeks for surveillance.  The patient was told to call with any further questions, or with any concerns about this method of contraception.  Emphasized use of condoms 100% of the time for STI prevention.  Need for ECP was assessed. Patient reported not meeting criteria.  Reviewed options and patient desired No method of ECP, declined all    1. Family planning Treat wet mount per standing orders Immunization nurse consult  - WET PREP FOR TRICH, YEAST, CLUE - Hemoglobin, venipuncture - Chlamydia/Gonorrhea [facility] - Pap IG (Image Guided)  2. Encounter for surveillance of injectable contraceptive May have DMPA 150 mg IM q 11-13 wks x 1 year     No follow-ups on file.  No future appointments.  Alberteen Spindle, CNM

## 2022-02-13 LAB — PAP IG (IMAGE GUIDED): PAP Smear Comment: 0

## 2022-02-15 ENCOUNTER — Other Ambulatory Visit: Payer: Self-pay | Admitting: Nurse Practitioner

## 2022-02-15 ENCOUNTER — Telehealth: Payer: Self-pay | Admitting: Family Medicine

## 2022-02-15 DIAGNOSIS — A599 Trichomoniasis, unspecified: Secondary | ICD-10-CM

## 2022-02-15 MED ORDER — TINIDAZOLE 500 MG PO TABS: 2.0000 g | ORAL_TABLET | Freq: Every day | ORAL | 0 refills | Status: AC

## 2022-02-15 NOTE — Telephone Encounter (Signed)
Pt calls & states she threw up her Metronidazole pills x 2 days. She has difficulty swallowing pills, Please advise if she needs more or not.

## 2022-02-15 NOTE — Progress Notes (Signed)
Patient states she is having trouble with taking the Metronidazole.  Patient reports vomiting multiple times and missing a couple of pills.  Patient offered Tinidazole as an alternate treatment.  Prescription called into pharmacy.  Gregary Cromer, FNP    1. Trichomonas infection 02/08/22  - tinidazole (TINDAMAX) 500 MG tablet; Take 4 tablets (2,000 mg total) by mouth daily with breakfast.  Dispense: 4 tablet; Refill: 0

## 2022-04-26 ENCOUNTER — Ambulatory Visit: Payer: Medicaid Other | Admitting: Family Medicine

## 2022-04-26 ENCOUNTER — Encounter: Payer: Self-pay | Admitting: Family Medicine

## 2022-04-26 ENCOUNTER — Ambulatory Visit (LOCAL_COMMUNITY_HEALTH_CENTER): Payer: Medicaid Other

## 2022-04-26 VITALS — BP 105/57 | Ht 65.0 in | Wt 215.0 lb

## 2022-04-26 DIAGNOSIS — Z113 Encounter for screening for infections with a predominantly sexual mode of transmission: Secondary | ICD-10-CM

## 2022-04-26 DIAGNOSIS — Z3009 Encounter for other general counseling and advice on contraception: Secondary | ICD-10-CM

## 2022-04-26 DIAGNOSIS — Z3042 Encounter for surveillance of injectable contraceptive: Secondary | ICD-10-CM | POA: Diagnosis not present

## 2022-04-26 DIAGNOSIS — Z309 Encounter for contraceptive management, unspecified: Secondary | ICD-10-CM | POA: Diagnosis not present

## 2022-04-26 LAB — WET PREP FOR TRICH, YEAST, CLUE
Trichomonas Exam: NEGATIVE
Yeast Exam: NEGATIVE

## 2022-04-26 NOTE — Progress Notes (Signed)
Pt. seen for routine STI screening. Wet mount results reviewed with pt. Condoms declined, no tx indicated.  

## 2022-04-26 NOTE — Progress Notes (Signed)
11 weeks 0 days post depo.   Pt explains she vomited Metronidazole  that was given to her at annual appt 02/08/2022 In order to treat trich.  On 02/15/2022, Onalee Hua, FNP called rx for Tindamax in to Patient's pharmacy. Pt explains she picked up Rx "late" and noticed on Rx a message that said to not take past a certain date and that date had passed and she did not take the medication. She has the medication at home.  She wants to know if she needs to be treated for trich. She states she has difficulty swallowing pills.   Pt denies discharge, itching, burning.  Last sex 07/2021.   Consult Aliene Altes, FNP who agrees to see pt now in [facility] to rescreen for trich and treatment if necessary.   RN discussed this with pt, who agrees to be seen in [facility].  Depo given today per order by Hazle Coca, CNM. Tolerated well R Delt. Next depo due 07/12/2022 and pt has reminder.   RN walked pt to [facility] waiting area and informed [employer] nurse coordinator of patient arrival. Jerel Shepherd, RN
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
## 2022-07-12 ENCOUNTER — Ambulatory Visit: Payer: Medicaid Other

## 2022-07-24 ENCOUNTER — Ambulatory Visit (LOCAL_COMMUNITY_HEALTH_CENTER): Payer: Medicaid Other

## 2022-07-24 VITALS — BP 129/58 | Ht 65.0 in | Wt 212.0 lb

## 2022-07-24 DIAGNOSIS — Z309 Encounter for contraceptive management, unspecified: Secondary | ICD-10-CM | POA: Diagnosis not present

## 2022-07-24 DIAGNOSIS — Z30013 Encounter for initial prescription of injectable contraceptive: Secondary | ICD-10-CM | POA: Diagnosis not present

## 2022-07-24 DIAGNOSIS — Z3009 Encounter for other general counseling and advice on contraception: Secondary | ICD-10-CM

## 2022-07-24 DIAGNOSIS — Z3042 Encounter for surveillance of injectable contraceptive: Secondary | ICD-10-CM

## 2022-07-24 NOTE — Progress Notes (Signed)
12 weeks post 5 days depo.  Denies any problems with method.  Depo administered IM left deltoid per order by E. Sciora CNM dated 02/08/22; tolerated well.  Next depo due 10/09/22 and pt encouraged to make appt as close to this date as possible.    Tonny Branch, RN

## 2022-07-29 ENCOUNTER — Ambulatory Visit (INDEPENDENT_AMBULATORY_CARE_PROVIDER_SITE_OTHER): Payer: Medicaid Other

## 2022-07-29 ENCOUNTER — Ambulatory Visit
Admission: EM | Admit: 2022-07-29 | Discharge: 2022-07-29 | Disposition: A | Discharge status: Home / Self Care | Payer: Medicaid Other | Attending: Urgent Care | Admitting: Urgent Care

## 2022-07-29 DIAGNOSIS — S99911A Unspecified injury of right ankle, initial encounter: Secondary | ICD-10-CM | POA: Diagnosis not present

## 2022-07-29 DIAGNOSIS — W19XXXA Unspecified fall, initial encounter: Secondary | ICD-10-CM

## 2022-07-29 NOTE — Discharge Instructions (Addendum)
Recommend evaluation at orthopedic urgent care for possible avulsion fracture based on x-ray result.

## 2022-07-29 NOTE — ED Provider Notes (Signed)
[facility]    CSN: [id number] Arrival date & time: 07/29/22  [id number]      History   Chief Complaint Chief Complaint  Patient presents with   Ankle Pain    HPI Carol Guerrero is a 22 y.o. female.    Ankle Pain   Patient presents to urgent care with complaint of right ankle pain since yesterday.  States she was descending stairs and "landed wrong".  Endorses swelling this morning. Not using any analgesia for pain. Non-weight bearing immediately after the injury and worsening since.  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 Authorizing Provider  tinidazole (TINDAMAX) 500 MG tablet Take 4 tablets (2,000 mg total) by mouth daily with breakfast. Patient not taking: Reported on 04/26/2022 02/15/22   Gregary Cromer, FNP    Family History Family History  Problem Relation Age of Onset   Diabetes Paternal Grandmother    Diabetes Maternal Grandmother    Diabetes Maternal Grandfather    Hypertension Mother    Heart Problems Brother     Bipolar disorder Brother    Healthy Sister     Social History Social History   Tobacco Use   Smoking status: Every Day    Types: Cigars    Passive exposure: Past   Smokeless tobacco: Never   Tobacco comments:    Black and miles daily   Vaping Use   Vaping Use: Former   Devices: last time in high school  Substance Use Topics   Alcohol use: Yes    Alcohol/week: 2.0 standard drinks of alcohol    Types: 2 Shots of liquor per week    Comment: occassionally -  last ETOH 02/02/22   Drug use: Yes    Types: Marijuana    Comment: once/twice a day     Allergies   Shrimp (diagnostic)   Review of Systems Review of Systems   Physical Exam Triage Vital Signs ED Triage Vitals  Enc Vitals Group     BP      Pulse      Resp      Temp      Temp src      SpO2      Weight  Height      Head Circumference      Peak Flow      Pain Score      Pain Loc      Pain Edu?      Excl. in [place]?    No data found.  Updated Vital Signs There were no vitals taken for this visit.  Visual Acuity Right Eye Distance:   Left Eye Distance:   Bilateral Distance:    Right Eye Near:   Left Eye Near:    Bilateral Near:     Physical Exam Vitals reviewed.  Constitutional:      Appearance: Normal appearance.  Musculoskeletal:     Right ankle: Swelling present. Tenderness present. Decreased range of motion.     Right Achilles Tendon: Tenderness present.     Comments: Swelling at the right lateral malleolus.  Exquisitely tender to touch.  Partial weightbearing.  Skin:    General: Skin is warm and dry.  Neurological:     General: No focal deficit present.     Mental Status: She is alert and oriented to person, place, and time.  Psychiatric:        Mood and Affect: Mood normal.        Behavior: Behavior normal.      UC Treatments / Results  Labs (all labs ordered are listed, but only abnormal results are displayed) Labs Reviewed - No data to display  EKG   Radiology No  results found.  Procedures Procedures (including critical care time)  Medications Ordered in UC Medications - No data to display  Initial Impression / Assessment and Plan / UC Course  I have reviewed the triage vital signs and the nursing notes.  Pertinent labs & imaging results that were available during my care of the patient were reviewed by me and considered in my medical decision making (see chart for details).   Partial weightbearing on the right ankle.  Swelling at the right lateral malleolus of the right ankle, exquisitely tender to palpation.  X-ray indicates:  Tiny bone density inferior to the medial malleolus that could  represent a tiny avulsion fracture due to deltoid ligament injury.  Mild lateral soft tissue swelling and joint fluid.  Recommend patient transfer care to orthopedic urgent care for evaluation of possible avulsion fracture.  Final Clinical Impressions(s) / UC Diagnoses   Final diagnoses:  None   Discharge Instructions   None    ED Prescriptions   None    PDMP not reviewed this encounter.   Rose Phi, FNP 07/29/22 1020

## 2022-07-29 NOTE — ED Triage Notes (Signed)
Patient presents to UC for right ankle pain since. States walking down stairs and landed wrong. Ankle swollen. Not taking anything for pain.

## 2022-10-09 ENCOUNTER — Ambulatory Visit (LOCAL_COMMUNITY_HEALTH_CENTER): Payer: Medicaid Other

## 2022-10-09 VITALS — BP 112/72 | Ht 65.0 in | Wt 208.5 lb

## 2022-10-09 DIAGNOSIS — Z309 Encounter for contraceptive management, unspecified: Secondary | ICD-10-CM

## 2022-10-09 DIAGNOSIS — Z3042 Encounter for surveillance of injectable contraceptive: Secondary | ICD-10-CM

## 2022-10-09 DIAGNOSIS — Z3009 Encounter for other general counseling and advice on contraception: Secondary | ICD-10-CM

## 2022-10-09 NOTE — Progress Notes (Signed)
11 weeks 0 days post depo. Depo given today per order by Hazle Coca, CNM dated 02/08/2022. Tolerated well R delt. Next depo due 12/25/2022, has reminder. Annual PE due 02/10/2023. Jerel Shepherd, RN

## 2022-10-16 ENCOUNTER — Ambulatory Visit: Payer: Medicaid Other

## 2022-12-25 ENCOUNTER — Ambulatory Visit: Payer: Medicaid Other

## 2022-12-27 ENCOUNTER — Ambulatory Visit: Payer: Medicaid Other

## 2022-12-27 VITALS — BP 113/75 | Ht 65.0 in | Wt 203.5 lb

## 2022-12-27 DIAGNOSIS — Z309 Encounter for contraceptive management, unspecified: Secondary | ICD-10-CM | POA: Diagnosis not present

## 2022-12-27 DIAGNOSIS — Z3009 Encounter for other general counseling and advice on contraception: Secondary | ICD-10-CM

## 2022-12-27 DIAGNOSIS — Z30013 Encounter for initial prescription of injectable contraceptive: Secondary | ICD-10-CM | POA: Diagnosis not present

## 2022-12-27 DIAGNOSIS — Z3042 Encounter for surveillance of injectable contraceptive: Secondary | ICD-10-CM

## 2022-12-27 MED ORDER — MEDROXYPROGESTERONE ACETATE 150 MG/ML IM SUSP
150.0000 mg | Freq: Once | INTRAMUSCULAR | Status: AC
  Administered 2022-12-27: 150 mg via INTRAMUSCULAR

## 2022-12-27 NOTE — Progress Notes (Signed)
11w 2d post depo. Voices no concerns. Depo given today per S.O. by Dr. Kirtland Bouchard. [place]. Tolerated well in R deltoid (per patient preference). Patient's annual physical due 02/10/2023; patient plans on scheduling physical with next depo due 03/14/2023. Patient has reminder card for both. All questions answered and verbalizes understanding.   Abagail Kitchens, RN

## 2023-02-24 ENCOUNTER — Ambulatory Visit: Payer: Medicaid Other

## 2023-03-07 ENCOUNTER — Ambulatory Visit (LOCAL_COMMUNITY_HEALTH_CENTER): Payer: Medicaid Other

## 2023-03-07 DIAGNOSIS — Z3009 Encounter for other general counseling and advice on contraception: Secondary | ICD-10-CM

## 2023-03-07 NOTE — Progress Notes (Signed)
Patient presents to nurse clinic for depo. Upon epic chart review, patient's last depo was 12/27/2022 which is 10 weeks 0 days since last depo. Patient is on medicaid and medicaid will not pay for depo if given before 11 weeks.  RN explained this to patient and states understanding.  RN walked with patient to clerk and depo along with annual PE scheduled 03/25/2023 at 10:50 am [facility]).  Has appt card. Jerel Shepherd, RN

## 2023-03-21 ENCOUNTER — Ambulatory Visit: Payer: Medicaid Other

## 2023-03-25 ENCOUNTER — Ambulatory Visit (LOCAL_COMMUNITY_HEALTH_CENTER): Payer: Medicaid Other | Admitting: Family Medicine

## 2023-03-25 ENCOUNTER — Encounter: Payer: Self-pay | Admitting: Family Medicine

## 2023-03-25 VITALS — BP 120/63 | HR 74 | Ht 65.0 in | Wt 210.8 lb

## 2023-03-25 DIAGNOSIS — Z3009 Encounter for other general counseling and advice on contraception: Secondary | ICD-10-CM

## 2023-03-25 DIAGNOSIS — Z30013 Encounter for initial prescription of injectable contraceptive: Secondary | ICD-10-CM

## 2023-03-25 DIAGNOSIS — Z309 Encounter for contraceptive management, unspecified: Secondary | ICD-10-CM | POA: Diagnosis not present

## 2023-03-25 DIAGNOSIS — Z113 Encounter for screening for infections with a predominantly sexual mode of transmission: Secondary | ICD-10-CM

## 2023-03-25 DIAGNOSIS — Z0000 Encounter for general adult medical examination without abnormal findings: Secondary | ICD-10-CM

## 2023-03-25 DIAGNOSIS — B9689 Other specified bacterial agents as the cause of diseases classified elsewhere: Secondary | ICD-10-CM

## 2023-03-25 LAB — WET PREP FOR TRICH, YEAST, CLUE
Trichomonas Exam: NEGATIVE
Yeast Exam: NEGATIVE

## 2023-03-25 MED ORDER — METRONIDAZOLE 500 MG PO TABS: 500.0000 mg | ORAL_TABLET | Freq: Two times a day (BID) | ORAL | Status: AC

## 2023-03-25 MED ORDER — MEDROXYPROGESTERONE ACETATE 150 MG/ML IM SUSP
150.0000 mg | INTRAMUSCULAR | Status: AC
  Administered 2023-03-25 – 2023-12-22 (×4): 150 mg via INTRAMUSCULAR

## 2023-03-25 NOTE — Progress Notes (Signed)
Pt is here for family planning visit.  Family planning packet reviewed and given to pt.  Wet prep results reviewed, treated per standing order. Depo given in L. Deltoid, reminder card given. Condoms declined. Gaspar Garbe, RN
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
## 2023-03-25 NOTE — Addendum Note (Signed)
Addended by: Lenice Llamas on: 03/25/2023 11:43 AM   Modules accepted: Orders

## 2023-04-16 ENCOUNTER — Ambulatory Visit: Payer: 59 | Admitting: Family Medicine

## 2023-04-16 NOTE — Progress Notes (Deleted)
New patient visit   Patient: Carol Guerrero   DOB: 12-20-2000   21 y.o. Female  MRN: [id number] Visit Date: 04/16/2023  Today's healthcare provider: Sherlyn Hay, DO   No chief complaint on file.  Subjective    Carol Guerrero is a 22 y.o. female who presents today as a new patient to establish care.  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 02/08/2022   Declines counseling   Syncope and collapse    Past Surgical History:  Procedure Laterality Date   NO PAST SURGERIES     Family Status  Relation Name Status   PGF unknwon (Not Specified)   PGM  Deceased   MGM  Alive   MGF  Alive   Father unknown Alive   Mother  Alive   Brother  Alive   Brother  Alive   Sister  Alive  No partnership data on file   Family History  Problem Relation Age of Onset   Diabetes Paternal Grandmother    Diabetes Maternal Grandmother    Diabetes Maternal Grandfather    Hypertension Mother    Heart Problems Brother    Bipolar disorder Brother    Healthy Sister    Social History   Socioeconomic History   Marital status: Single    Spouse name: Not on file   Number of children: 1   Years of education: 12th   Highest education level: High school graduate  Occupational History    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  Tobacco Use   Smoking status: Every Day    Types: Cigars    Passive exposure: Past   Smokeless tobacco: Never   Tobacco comments:    Black and miles daily   Vaping Use   Vaping status: Former   Devices: last time in high school  Substance and Sexual Activity   Alcohol use: Yes    Alcohol/week: 2.0 standard drinks of  alcohol    Types: 2 Shots of liquor per week    Comment: occassionally -  last ETOH 02/02/22   Drug use: Yes    Types: Marijuana    Comment: once/twice a day   Sexual activity: Not Currently    Partners: Male    Birth control/protection: Injection, Condom    Comment: wishes to continue depo  Other Topics Concern   Not on file  Social History Narrative   Right handed    Social Determinants of Health   Financial Resource Strain: Low Risk  (04/02/2018)   Overall Financial Resource Strain (CARDIA)    Difficulty of Paying Living Expenses: Not hard at all  Food Insecurity: No Food Insecurity (04/02/2018)   Hunger Vital Sign    Worried About Running Out of Food in the Last Year: Never true    Ran Out of Food in the Last Year: Never true  Transportation Needs: Unknown (04/02/2018)   PRAPARE - Administrator, Civil Service (Medical): No    Lack of Transportation (Non-Medical): Not on file  Physical Activity: Not on file  Stress: No Stress Concern Present (04/02/2018)   [employer] of  Occupational Health - Occupational Stress Questionnaire    Feeling of Stress : Only a little  Social Connections: Not on file   Outpatient Medications Prior to Visit  Medication Sig   tinidazole (TINDAMAX) 500 MG tablet Take 4 tablets (2,000 mg total) by mouth daily with breakfast. (Patient not taking: Reported on 04/26/2022)   Facility-Administered Medications Prior to Visit  Medication Dose Route Frequency Provider   medroxyPROGESTERone (DEPO-PROVERA) injection 150 mg  150 mg Intramuscular Q90 days    Allergies  Allergen Reactions   Shrimp (Diagnostic) Nausea And Vomiting and Swelling    Seafood    Immunization History  Administered Date(s) Administered   Influenza,inj,Quad PF,6+ Mos 03/09/2018   Tdap 01/20/2018    Health Maintenance  Topic Date Due   HPV VACCINES (1 - 3-dose series) Never done   Hepatitis C Screening  Never done   CHLAMYDIA SCREENING  03/27/2020    INFLUENZA VACCINE  12/19/2022   COVID-19 Vaccine (1 - 2023-24 season) Never done   Cervical Cancer Screening (Pap smear)  02/08/2025   DTaP/Tdap/Td (2 - Td or Tdap) 01/21/2028   HIV Screening  Completed    Patient Care Team: Patient, No Pcp Per as PCP - General (General Practice)  Review of Systems  {Insert previous labs (optional):23779} {See past labs  Heme  Chem  Endocrine  Serology  Results Review (optional):1}   Objective    There were no vitals taken for this visit. {Insert last BP/Wt (optional):23777}{See vitals history (optional):1}   Physical Exam   Depression Screen    03/25/2023   11:02 AM 02/08/2022   10:36 AM 12/25/2020   10:37 AM 04/07/2018    1:50 PM  PHQ 2/9 Scores  PHQ - 2 Score 0 2 0 0  PHQ- 9 Score  7  0   No results found for any visits on 04/16/23.  Assessment & Plan     There are no diagnoses linked to this encounter.   ***  No follow-ups on file.     I discussed the assessment and treatment plan with the patient  The patient was provided an opportunity to ask questions and all were answered. The patient agreed with the plan and demonstrated an understanding of the instructions.   The patient was advised to call back or seek an in-person evaluation if the symptoms worsen or if the condition fails to improve as anticipated.    Sherlyn Hay, DO  [facility] [phone] (phone) [phone] (fax)  [facility]

## 2023-06-11 ENCOUNTER — Ambulatory Visit: Payer: 59

## 2023-06-11 VITALS — BP 124/63 | Ht 65.0 in | Wt 212.5 lb

## 2023-06-11 DIAGNOSIS — Z3009 Encounter for other general counseling and advice on contraception: Secondary | ICD-10-CM

## 2023-06-11 DIAGNOSIS — Z30013 Encounter for initial prescription of injectable contraceptive: Secondary | ICD-10-CM

## 2023-06-11 DIAGNOSIS — Z3042 Encounter for surveillance of injectable contraceptive: Secondary | ICD-10-CM

## 2023-06-11 NOTE — Progress Notes (Signed)
11w 1d post depo. Voices no concerns. Depo given today per order by Aliene Altes, FNP dated 03/25/2023. Tolerated well in R deltoid. Next depo due 08/27/2023; patient aware.   Abagail Kitchens, RN

## 2023-06-27 ENCOUNTER — Encounter: Payer: Self-pay | Admitting: Physician Assistant

## 2023-06-27 ENCOUNTER — Ambulatory Visit (INDEPENDENT_AMBULATORY_CARE_PROVIDER_SITE_OTHER): Payer: 59 | Admitting: Physician Assistant

## 2023-06-27 VITALS — BP 115/67 | HR 68 | Temp 98.7°F | Ht 65.0 in | Wt 208.6 lb

## 2023-06-27 DIAGNOSIS — F17290 Nicotine dependence, other tobacco product, uncomplicated: Secondary | ICD-10-CM

## 2023-06-27 DIAGNOSIS — R002 Palpitations: Secondary | ICD-10-CM | POA: Diagnosis not present

## 2023-06-27 DIAGNOSIS — E669 Obesity, unspecified: Secondary | ICD-10-CM | POA: Diagnosis not present

## 2023-06-27 DIAGNOSIS — Z6834 Body mass index (BMI) 34.0-34.9, adult: Secondary | ICD-10-CM

## 2023-06-27 DIAGNOSIS — F17200 Nicotine dependence, unspecified, uncomplicated: Secondary | ICD-10-CM | POA: Insufficient documentation

## 2023-06-27 DIAGNOSIS — R55 Syncope and collapse: Secondary | ICD-10-CM

## 2023-06-27 DIAGNOSIS — R0789 Other chest pain: Secondary | ICD-10-CM | POA: Insufficient documentation

## 2023-06-27 DIAGNOSIS — F1290 Cannabis use, unspecified, uncomplicated: Secondary | ICD-10-CM

## 2023-06-27 DIAGNOSIS — Z7689 Persons encountering health services in other specified circumstances: Secondary | ICD-10-CM
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
## 2023-06-28 DIAGNOSIS — R55 Syncope and collapse: Secondary | ICD-10-CM | POA: Insufficient documentation

## 2023-06-28 LAB — CBC WITH DIFFERENTIAL/PLATELET
Basophils Absolute: 0 10*3/uL (ref 0.0–0.2)
Basos: 0 %
EOS (ABSOLUTE): 0 10*3/uL (ref 0.0–0.4)
Eos: 0 %
Hematocrit: 34.9 % (ref 34.0–46.6)
Hemoglobin: 11.7 g/dL (ref 11.1–15.9)
Immature Grans (Abs): 0 10*3/uL (ref 0.0–0.1)
Immature Granulocytes: 0 %
Lymphocytes Absolute: 2.4 10*3/uL (ref 0.7–3.1)
Lymphs: 36 %
MCH: 32.8 pg (ref 26.6–33.0)
MCHC: 33.5 g/dL (ref 31.5–35.7)
MCV: 98 fL — ABNORMAL HIGH (ref 79–97)
Monocytes Absolute: 0.4 10*3/uL (ref 0.1–0.9)
Monocytes: 6 %
Neutrophils Absolute: 3.9 10*3/uL (ref 1.4–7.0)
Neutrophils: 58 %
Platelets: 224 10*3/uL (ref 150–450)
RBC: 3.57 x10E6/uL — ABNORMAL LOW (ref 3.77–5.28)
RDW: 11.8 % (ref 11.7–15.4)
WBC: 6.8 10*3/uL (ref 3.4–10.8)

## 2023-06-28 LAB — LIPID PANEL
Chol/HDL Ratio: 3.9 {ratio} (ref 0.0–4.4)
Cholesterol, Total: 150 mg/dL (ref 100–199)
HDL: 38 mg/dL — ABNORMAL LOW (ref >39)
LDL Chol Calc (NIH): 103 mg/dL — ABNORMAL HIGH (ref 0–99)
Triglycerides: 40 mg/dL (ref 0–149)
VLDL Cholesterol Cal: 9 mg/dL (ref 5–40)

## 2023-06-28 LAB — COMPREHENSIVE METABOLIC PANEL
ALT: 23 [IU]/L (ref 0–32)
AST: 26 [IU]/L (ref 0–40)
Albumin: 4.7 g/dL (ref 4.0–5.0)
Alkaline Phosphatase: 116 [IU]/L (ref 44–121)
BUN/Creatinine Ratio: 14 (ref 9–23)
BUN: 12 mg/dL (ref 6–20)
Bilirubin Total: 0.5 mg/dL (ref 0.0–1.2)
CO2: 19 mmol/L — ABNORMAL LOW (ref 20–29)
Calcium: 9.2 mg/dL (ref 8.7–10.2)
Chloride: 107 mmol/L — ABNORMAL HIGH (ref 96–106)
Creatinine, Ser: 0.83 mg/dL (ref 0.57–1.00)
Globulin, Total: 2.7 g/dL (ref 1.5–4.5)
Glucose: 101 mg/dL — ABNORMAL HIGH (ref 70–99)
Potassium: 4.5 mmol/L (ref 3.5–5.2)
Sodium: 141 mmol/L (ref 134–144)
Total Protein: 7.4 g/dL (ref 6.0–8.5)
eGFR: 101 mL/min/{1.73_m2} (ref >59)

## 2023-06-28 LAB — HEMOGLOBIN A1C
Est. average glucose Bld gHb Est-mCnc: 105 mg/dL
Hgb A1c MFr Bld: 5.3 % (ref 4.8–5.6)

## 2023-06-28 LAB — TSH: TSH: 0.858 u[IU]/mL (ref 0.450–4.500)

## 2023-06-30 ENCOUNTER — Encounter: Payer: Self-pay | Admitting: Physician Assistant

## 2023-07-11 ENCOUNTER — Ambulatory Visit: Payer: 59 | Admitting: Physician Assistant

## 2023-07-30 NOTE — Progress Notes (Deleted)
 Established patient visit  Patient: Carol Guerrero   DOB: 2000-05-23   23 y.o. Female  MRN: [id number] Visit Date: 07/31/2023  Today's healthcare provider: Debera Lat, PA-C   No chief complaint on file.  Subjective       Discussed the use of AI scribe software for clinical note transcription with the patient, who gave verbal consent to proceed.  History of Present Illness               06/27/2023    9:28 AM 03/25/2023   11:02 AM 02/08/2022   10:36 AM  Depression screen PHQ 2/9  Decreased Interest 2 0 2  Down, Depressed, Hopeless 0 0 0  PHQ - 2 Score 2 0 2  Altered sleeping 0  0  Tired, decreased energy 0  2  Change in appetite 2  3  Feeling bad or failure about yourself  0  0  Trouble concentrating 0  0  Moving slowly or fidgety/restless 0  0  Suicidal thoughts 0  0  PHQ-9 Score 4  7  Difficult doing work/chores Not difficult at all        06/27/2023    9:28 AM 04/07/2018    1:50 PM 03/31/2018    1:30 PM 03/24/2018    1:46 PM  GAD 7 : Generalized Anxiety Score  Nervous, Anxious, on Edge 0 0 0 1  Control/stop worrying 2 0 0 0  Worry too much - different things 2 0 0 0  Trouble relaxing 0 3 3 1   Restless 0 0 0 0  Easily annoyed or irritable 0 1 2 3   Afraid - awful might happen 1 0 0 0  Total GAD 7 Score 5 4 5 5   Anxiety Difficulty Not difficult at all       Medications: Outpatient Medications Prior to Visit  Medication Sig  . tinidazole (TINDAMAX) 500 MG tablet Take 4 tablets (2,000 mg total) by mouth daily with breakfast. (Patient not taking: Reported on 06/27/2023)   Facility-Administered Medications Prior to Visit  Medication Dose Route Frequency Provider  . medroxyPROGESTERone (DEPO-PROVERA) injection 150 mg  150 mg Intramuscular Q90 days     Review of Systems  All other systems reviewed and are negative. All negative Except see HPI   {Insert previous labs (optional):23779} {See past labs  Heme  Chem  Endocrine  Serology  Results Review  (optional):1}   Objective    There were no vitals taken for this visit. {Insert last BP/Wt (optional):23777}{See vitals history (optional):1}   Physical Exam Vitals reviewed.  Constitutional:      General: She is not in acute distress.    Appearance: Normal appearance. She is well-developed. She is not diaphoretic.  HENT:     Head: Normocephalic and atraumatic.  Eyes:     General: No scleral icterus.    Conjunctiva/sclera: Conjunctivae normal.  Neck:     Thyroid: No thyromegaly.  Cardiovascular:     Rate and Rhythm: Normal rate and regular rhythm.     Pulses: Normal pulses.     Heart sounds: Normal heart sounds. No murmur heard. Pulmonary:     Effort: Pulmonary effort is normal. No respiratory distress.     Breath sounds: Normal breath sounds. No wheezing, rhonchi or rales.  Musculoskeletal:     Cervical back: Neck supple.     Right lower leg: No edema.     Left lower leg: No edema.  Lymphadenopathy:     Cervical: No cervical adenopathy.  Skin:  General: Skin is warm and dry.     Findings: No rash.  Neurological:     Mental Status: She is alert and oriented to person, place, and time. Mental status is at baseline.  Psychiatric:        Mood and Affect: Mood normal.        Behavior: Behavior normal.     No results found for any visits on 07/31/23.      Assessment and Plan             No orders of the defined types were placed in this encounter.   No follow-ups on file.   The patient was advised to call back or seek an in-person evaluation if the symptoms worsen or if the condition fails to improve as anticipated.  I discussed the assessment and treatment plan with the patient. The patient was provided an opportunity to ask questions and all were answered. The patient agreed with the plan and demonstrated an understanding of the instructions.  I, Debera Lat, PA-C have reviewed all documentation for this visit. The documentation on 07/31/2023  for the  exam, diagnosis, procedures, and orders are all accurate and complete.  Debera Lat, [facility], MMS [facility] [phone] (phone) [phone] (fax)  [facility]

## 2023-07-31 ENCOUNTER — Ambulatory Visit: Admitting: Physician Assistant

## 2023-07-31 DIAGNOSIS — R002 Palpitations: Secondary | ICD-10-CM

## 2023-07-31 DIAGNOSIS — F1290 Cannabis use, unspecified, uncomplicated: Secondary | ICD-10-CM

## 2023-07-31 DIAGNOSIS — R0789 Other chest pain: Secondary | ICD-10-CM

## 2023-07-31 DIAGNOSIS — F17290 Nicotine dependence, other tobacco product, uncomplicated: Secondary | ICD-10-CM

## 2023-07-31 DIAGNOSIS — R55 Syncope and collapse: Secondary | ICD-10-CM

## 2023-07-31 DIAGNOSIS — E669 Obesity, unspecified: Secondary | ICD-10-CM

## 2023-09-02 ENCOUNTER — Ambulatory Visit: Payer: Self-pay

## 2023-09-02 VITALS — BP 110/66 | Ht 65.0 in | Wt 207.5 lb

## 2023-09-02 DIAGNOSIS — Z30013 Encounter for initial prescription of injectable contraceptive: Secondary | ICD-10-CM

## 2023-09-02 DIAGNOSIS — Z3009 Encounter for other general counseling and advice on contraception: Secondary | ICD-10-CM

## 2023-09-02 DIAGNOSIS — Z3042 Encounter for surveillance of injectable contraceptive: Secondary | ICD-10-CM

## 2023-09-02 DIAGNOSIS — Z309 Encounter for contraceptive management, unspecified: Secondary | ICD-10-CM

## 2023-09-02 NOTE — Progress Notes (Signed)
 11w 6d post depo. Voices no concerns. Depo given today per order by Fain Home, FNP dated 03/25/2023. Tolerated well in L deltoid. Next depo due 11/18/2023; patient aware.   Clare Critchley, RN

## 2023-11-19 ENCOUNTER — Ambulatory Visit: Payer: Self-pay

## 2023-11-27 ENCOUNTER — Ambulatory Visit: Payer: Self-pay

## 2023-12-11 ENCOUNTER — Ambulatory Visit: Payer: Self-pay

## 2023-12-22 ENCOUNTER — Ambulatory Visit: Payer: Self-pay

## 2023-12-22 VITALS — BP 112/58 | Ht 65.0 in | Wt 211.0 lb

## 2023-12-22 DIAGNOSIS — Z3009 Encounter for other general counseling and advice on contraception: Secondary | ICD-10-CM

## 2023-12-22 DIAGNOSIS — Z3042 Encounter for surveillance of injectable contraceptive: Secondary | ICD-10-CM

## 2023-12-22 DIAGNOSIS — Z309 Encounter for contraceptive management, unspecified: Secondary | ICD-10-CM

## 2023-12-22 NOTE — Progress Notes (Signed)
 15 weeks 3 days post depo. Voices no concerns. Depo given per order by H.Middleton, FNP dated 03/25/23. Tolerated well to R delt. Next depo due 03/08/24.

## 2024-04-05 ENCOUNTER — Ambulatory Visit: Payer: Self-pay

## 2024-04-08 ENCOUNTER — Ambulatory Visit: Payer: Self-pay

## 2024-04-19 ENCOUNTER — Encounter: Payer: Self-pay | Admitting: Family Medicine

## 2024-04-19 ENCOUNTER — Ambulatory Visit: Payer: Self-pay | Admitting: Family Medicine

## 2024-04-19 VITALS — BP 105/66 | HR 59 | Ht 65.0 in | Wt 208.0 lb

## 2024-04-19 DIAGNOSIS — Z0000 Encounter for general adult medical examination without abnormal findings: Secondary | ICD-10-CM

## 2024-04-19 DIAGNOSIS — R42 Dizziness and giddiness: Secondary | ICD-10-CM

## 2024-04-19 DIAGNOSIS — Z3009 Encounter for other general counseling and advice on contraception: Secondary | ICD-10-CM

## 2024-04-19 LAB — HEMOGLOBIN, FINGERSTICK: Hemoglobin: 11.3 g/dL (ref 11.1–15.9)

## 2024-04-19 MED ORDER — MEDROXYPROGESTERONE ACETATE 150 MG/ML IM SUSP
150.0000 mg | INTRAMUSCULAR | Status: AC
  Administered 2024-04-19: 150 mg via INTRAMUSCULAR

## 2024-04-19 NOTE — Progress Notes (Signed)
 [facility] [address], [address] [place] [address] Main phone: [phone]  Family Planning Visit - Repeat Yearly Visit  Subjective:  Carol Guerrero is a 23 y.o. G1P1001  being seen today for an annual wellness visit and to discuss contraception options. The patient is currently using hormonal injection for pregnancy prevention. Patient does not want a pregnancy in the next year.   Patient reports they are looking for a method with the following characteristics:  High efficacy at preventing pregnancy  Patient has the following medical problems:  Patient Active Problem List   Diagnosis Date Noted   Syncope 06/28/2023   Chest tightness 06/27/2023   Smoking 06/27/2023   Palpitation 06/27/2023   Cigar smoker 02/08/2022   Marijuana use 02/08/2022   Obesity BMI=35.7 02/08/2022   Trichomonas infection 02/08/22 02/08/2022   Chief Complaint  Patient presents with   Annual Exam    HPI Patient reports to clinic for depo and PE  Patient denies concerns about self   Review of Systems  Eyes:  Positive for blurred vision.  Neurological:  Positive for dizziness.    See flowsheet for further details and programmatic requirements Hyperlink available at the top of the signed note in blue.  Flow sheet content below:  Pregnancy Intention Screening Does the patient want to become pregnant in the next year?: No Does the patient's partner want to become pregnant in the next year?: No Would the patient like to discuss contraceptive options today?: No Other:  Difficulty accessing hygiene products (feminine products/soap) in the last 3 months: No Contraception History Past methods of contraception used by patient:: Hormonal Injection, Hormonal Implant, Contraceptive Pill Adverse effects associated with Contraceptive Pill: pregnant Adverse effects associated with Hormonal Injection: none Adverse effects associated with  Hormonal Implant: bleeding Sexual History What age did you start your period?: 9 How often do you have your period?: none with depo Date of last sex?: 04/14/24 Has the patient had unprotected sex within the last 5 days?: No Do you have sex with men, women, both men and women?: Men only In the past 2 months how many partners have you had sex with?: 1 In the past 12 months, how many partners have you had sex with?: 1 Is it possible that any of your sex partners in the past 12 months had sex with someone else whild they were still in a sexual relationship with you?: Yes What ways do you have sex?: Vaginal Do you or your partner use condoms and/or dental dams every time you have vaginal, oral or anal sex?: No Do you douche?: No Date of last HIV test?: 03/28/19 Have you ever had an STD?: Yes Have any of your partners had an STD?: Yes Partner Previous STD?: Chlamydia Have you or your partner ever shot up drugs?: No Have any of your partners used drugs in the past?: No Have you or your partners exchanged money or drugs for sex?: No Counseling Education: Make informed decision about family planning, Provided preconception counseling, Reduce risk of transmission and protection from STD's and HIV, Understand BMI >25 or >18.5 is a health risk (weight management educational materials to be provided to client requests), Promoted daily consumption of MVI with folic acid if capable of conceiving., Review immunization history, inform client of recommended vaccines per CDC's ACIP Guidelines and refer to Immunization clinic, Results of physical assessment and labs (if performed), How to discontinue the method selected and information on back up method used, How  to use the method selected and information on back up method used, How to use the method consistently and correctly, Warning signs for rare but serious adverse events and what to do if they experience a warning sign (including emergency 24 hour number,  where to seek emergency service outside of hours of operation), When to return for follow up (planned return schedule), Is patient pregnant?  Diabetes screening This patient is 23 y.o. with a BMI of Body mass index is 34.61 kg/m.SABRA  Is patient eligible for diabetes screening (age >35 and BMI >25)?  no  Was Hgb A1c ordered? not applicable  STI screening Patient reports 1 of partners in last year.  Does this patient desire STI screening?  No - declined  Hepatitis C screening Has patient been screened once for HCV in the past?  No  No results found for: HCVAB  Does the patient meet criteria for HCV testing? No  (If yes-- Screen for HCV through [facility]) Criteria:  Since the last HCV result, does the patient have any of the following? - Current drug use - Have a partner with drug use - Has been incarcerated  Hepatitis B screening Does the patient meet criteria for HBV testing? No Criteria:  -Household, sexual or needle sharing contact with HBV -History of drug use -HIV positive -Those with known Hep C  Cervical Cancer Screening  Result Date Procedure Results Follow-ups  02/08/2022 Pap IG (Image Guided) DIAGNOSIS:: Comment Specimen adequacy:: Comment Clinician Provided ICD10: Comment Performed by:: Comment PAP Smear Comment: . Note:: Comment Test Methodology: Comment     Health Maintenance Due  Topic Date Due   HPV VACCINES (1 - 3-dose series) Never done   Hepatitis C Screening  Never done   Pneumococcal Vaccine (1 of 2 - PCV) Never done   Hepatitis B Vaccines 19-59 Average Risk (1 of 3 - 19+ 3-dose series) Never done   CHLAMYDIA SCREENING  03/27/2020   Influenza Vaccine  12/19/2023   COVID-19 Vaccine (1 - 2025-26 season) Never done    The following portions of the patient's history were reviewed and updated as appropriate: allergies, current medications, past family history, past medical history, past social history, past surgical history and problem list.  Problem list updated.  Objective:   Vitals:   04/19/24 1042  BP: 105/66  Pulse: (!) 59  Weight: 208 lb (94.3 kg)  Height: 5' 5 (1.651 m)    Physical Exam Constitutional:      Appearance: She is obese.  HENT:     Head: Normocephalic and atraumatic.  Pulmonary:     Effort: Pulmonary effort is normal.  Abdominal:     Palpations: Abdomen is soft.  Musculoskeletal:        General: Normal range of motion.  Skin:    General: Skin is warm and dry.  Neurological:     General: No focal deficit present.     Mental Status: She is alert.  Psychiatric:        Mood and Affect: Mood normal.        Behavior: Behavior normal.     Assessment and Plan:  Everette Dimauro is a 23 y.o. female G1P1001 presenting to the [facility] Department for an yearly wellness and contraception visit  1. Family planning (Primary) Contraception counseling:  Reviewed options based on patient desire and reproductive life plan. Patient is interested in Hormonal Injection. This was provided to the patient today.  Encouraged to take PT at home in 2  weeks.  Risks, benefits, and typical effectiveness rates were reviewed.  Questions were answered.  Written information was also given to the patient to review.    The patient will follow up in  3 months for surveillance.  The patient was told to call with any further questions, or with any concerns about this method of contraception.  Emphasized use of condoms 100% of the time for STI prevention.  Emergency Contraception Precautions (ECP): Patient assessed for need of ECP. She is not a candidate based on report of unprotected sex more than 120 hours ago (5 days). Covered under depo during this encounter.  Educated on ECP and reviewed options.  Patient desires no method - patient politely declines any emergency contraception.   2. Well woman exam (no gynecological exam) Pap smear up to date. CBE not indicated until 25 per ACOG guidelines -reports  dizziness and blurred vision which she has had for years states she went to cards and neurology but reports they didn't diagnose her with anything -agreed to Hgb today to look for anemia  Return in about 3 months (around 07/18/2024) for depo injection.  No future appointments.  Verneta Bers, [place]

## 2024-04-19 NOTE — Progress Notes (Signed)
 Hemoglobin results reviewed, no treatment per SO. Depo given in L deltoid and tolerated well, reminder card given. Larraine JONELLE Novak, RN
